# Patient Record
Sex: Male | Born: 1980 | Race: White | Hispanic: No | Marital: Single | Smoking: Former smoker
Health system: Southern US, Community
[De-identification: ages and names within clinical notes are randomized; demographics above are authoritative.]

## PROBLEM LIST (undated history)

## (undated) DIAGNOSIS — M5126 Other intervertebral disc displacement, lumbar region: Secondary | ICD-10-CM

## (undated) DIAGNOSIS — I10 Essential (primary) hypertension: Secondary | ICD-10-CM

---

## 2004-05-26 ENCOUNTER — Emergency Department: Payer: Self-pay | Admitting: Emergency Medicine

## 2007-04-27 ENCOUNTER — Emergency Department: Payer: Self-pay | Admitting: Emergency Medicine

## 2007-06-04 ENCOUNTER — Emergency Department: Payer: Self-pay | Admitting: Emergency Medicine

## 2007-07-08 ENCOUNTER — Ambulatory Visit: Payer: Self-pay | Admitting: Family Medicine

## 2013-09-01 ENCOUNTER — Encounter (HOSPITAL_COMMUNITY): Payer: Self-pay | Admitting: Pharmacy Technician

## 2013-09-01 ENCOUNTER — Other Ambulatory Visit: Payer: Self-pay | Admitting: Orthopedic Surgery

## 2013-09-01 NOTE — Progress Notes (Signed)
Need orders please - pt coming for preop THURS 09/03/13 - thank you

## 2013-09-02 ENCOUNTER — Other Ambulatory Visit (HOSPITAL_COMMUNITY): Payer: Self-pay | Admitting: Specialist

## 2013-09-02 NOTE — Patient Instructions (Signed)
Your procedure is scheduled on:  09/09/13  [facility]  [facility]- FOLLOW SIGNS TO SHORT STAY CENTER Short Stay Center at    0900   AM.   Call this number if you have problems the morning of surgery: [phone]        Do not eat food  Or drink :After Midnight. Tuesday NIGHT   Take these medicines the morning of surgery with A SIP OF WATER: MAY TAKE PERCOCET/ TORADOL/ ROBAXIN IF NEEDED   .  Contacts, dentures or partial plates, or metal hairpins  can not be worn to surgery. Your family will be responsible for glasses, dentures, hearing aides while you are in surgery  Leave suitcase in the car. After surgery it may be brought to your room.  For patients admitted to the hospital, checkout time is 11:00 AM day of  discharge.         [name] IS NOT RESPONSIBLE FOR ANY VALUABLES                                                                                                                                [name] - Preparing for Surgery Before surgery, you can play an important role.  Because skin is not sterile, your skin needs to be as free of germs as possible.  You can reduce the number of germs on your skin by washing with CHG (chlorahexidine gluconate) soap before surgery.  CHG is an antiseptic cleaner which kills germs and bonds with the skin to continue killing germs even after washing. Please DO NOT use if you have an allergy to CHG or antibacterial soaps.  If your skin becomes reddened/irritated stop using the CHG and inform your nurse when you arrive at Short Stay. Do not shave (including legs and underarms) for at least 48 hours prior to the first CHG shower.  You may shave your face/neck. Please follow these instructions carefully:  1.  Shower with CHG Soap the night before surgery and the  morning of Surgery.  2.  If you choose to wash your hair, wash your hair first as usual with your  normal  shampoo.  3.  After you shampoo, rinse  your hair and body thoroughly to remove the  shampoo.                           4.  Use CHG as you would any other liquid soap.  You can apply chg directly  to the skin and wash                       Gently with a scrungie or clean washcloth.  5.  Apply the CHG Soap to your body ONLY FROM THE NECK DOWN.   Do not use on face/ open  Wound or open sores. Avoid contact with eyes, ears mouth and genitals (private parts).                       Wash face,  Genitals (private parts) with your normal soap.             6.  Wash thoroughly, paying special attention to the area where your surgery  will be performed.  7.  Thoroughly rinse your body with warm water from the neck down.  8.  DO NOT shower/wash with your normal soap after using and rinsing off  the CHG Soap.                9.  Pat yourself dry with a clean towel.            10.  Wear clean pajamas.            11.  Place clean sheets on your bed the night of your first shower and do not  sleep with pets. Day of Surgery : Do not apply any lotions/deodorants the morning of surgery.  Please wear clean clothes to the hospital/surgery center.  FAILURE TO FOLLOW THESE INSTRUCTIONS MAY RESULT IN THE CANCELLATION OF YOUR SURGERY PATIENT SIGNATURE_________________________________  NURSE SIGNATURE__________________________________  ________________________________________________________________________ [facility] - Preparing for Surgery Before surgery, you can play an important role.  Because skin is not sterile, your skin needs to be as free of germs as possible.  You can reduce the number of germs on your skin by washing with CHG (chlorahexidine gluconate) soap before surgery.  CHG is an antiseptic cleaner which kills germs and bonds with the skin to continue killing germs even after washing. Please DO NOT use if you have an allergy to CHG or antibacterial soaps.  If your skin becomes reddened/irritated stop using the CHG and  inform your nurse when you arrive at Short Stay. Do not shave (including legs and underarms) for at least 48 hours prior to the first CHG shower.  You may shave your face/neck. Please follow these instructions carefully:  1.  Shower with CHG Soap the night before surgery and the  morning of Surgery.  2.  If you choose to wash your hair, wash your hair first as usual with your  normal  shampoo.  3.  After you shampoo, rinse your hair and body thoroughly to remove the  shampoo.                           4.  Use CHG as you would any other liquid soap.  You can apply chg directly  to the skin and wash                       Gently with a scrungie or clean washcloth.  5.  Apply the CHG Soap to your body ONLY FROM THE NECK DOWN.   Do not use on face/ open                           Wound or open sores. Avoid contact with eyes, ears mouth and genitals (private parts).                       Wash face,  Genitals (private parts) with your normal soap.  6.  Wash thoroughly, paying special attention to the area where your surgery  will be performed.  7.  Thoroughly rinse your body with warm water from the neck down.  8.  DO NOT shower/wash with your normal soap after using and rinsing off  the CHG Soap.                9.  Pat yourself dry with a clean towel.            10.  Wear clean pajamas.            11.  Place clean sheets on your bed the night of your first shower and do not  sleep with pets. Day of Surgery : Do not apply any lotions/deodorants the morning of surgery.  Please wear clean clothes to the hospital/surgery center.  FAILURE TO FOLLOW THESE INSTRUCTIONS MAY RESULT IN THE CANCELLATION OF YOUR SURGERY PATIENT SIGNATURE_________________________________  NURSE SIGNATURE__________________________________  ________________________________________________________________________

## 2013-09-03 ENCOUNTER — Encounter (INDEPENDENT_AMBULATORY_CARE_PROVIDER_SITE_OTHER): Payer: Self-pay

## 2013-09-03 ENCOUNTER — Ambulatory Visit (HOSPITAL_COMMUNITY)
Admission: RE | Admit: 2013-09-03 | Discharge: 2013-09-03 | Disposition: A | Discharge status: Home / Self Care | Payer: PRIVATE HEALTH INSURANCE | Source: Ambulatory Visit | Attending: Anesthesiology | Admitting: Anesthesiology

## 2013-09-03 ENCOUNTER — Ambulatory Visit (HOSPITAL_COMMUNITY)
Admission: RE | Admit: 2013-09-03 | Discharge: 2013-09-03 | Disposition: A | Discharge status: Home / Self Care | Payer: PRIVATE HEALTH INSURANCE | Source: Ambulatory Visit | Attending: Orthopedic Surgery | Admitting: Orthopedic Surgery

## 2013-09-03 ENCOUNTER — Other Ambulatory Visit: Payer: Self-pay | Admitting: Orthopedic Surgery

## 2013-09-03 ENCOUNTER — Encounter (HOSPITAL_COMMUNITY)
Admission: RE | Admit: 2013-09-03 | Discharge: 2013-09-03 | Disposition: A | Discharge status: Home / Self Care | Payer: PRIVATE HEALTH INSURANCE | Source: Ambulatory Visit | Attending: Specialist | Admitting: Specialist

## 2013-09-03 ENCOUNTER — Encounter (HOSPITAL_COMMUNITY): Payer: Self-pay

## 2013-09-03 DIAGNOSIS — Z01810 Encounter for preprocedural cardiovascular examination: Secondary | ICD-10-CM | POA: Insufficient documentation

## 2013-09-03 DIAGNOSIS — M4120 Other idiopathic scoliosis, site unspecified: Secondary | ICD-10-CM | POA: Insufficient documentation

## 2013-09-03 DIAGNOSIS — Z01812 Encounter for preprocedural laboratory examination: Secondary | ICD-10-CM | POA: Insufficient documentation

## 2013-09-03 DIAGNOSIS — Z01818 Encounter for other preprocedural examination: Secondary | ICD-10-CM | POA: Insufficient documentation

## 2013-09-03 HISTORY — DX: Other intervertebral disc displacement, lumbar region: M51.26

## 2013-09-03 HISTORY — DX: Essential (primary) hypertension: I10

## 2013-09-03 LAB — SURGICAL PCR SCREEN
MRSA, PCR: NEGATIVE
Staphylococcus aureus: POSITIVE — AB

## 2013-09-03 LAB — BASIC METABOLIC PANEL
BUN: 16 mg/dL (ref 6–23)
CALCIUM: 10 mg/dL (ref 8.4–10.5)
CO2: 26 mEq/L (ref 19–32)
CREATININE: 0.75 mg/dL (ref 0.50–1.35)
Chloride: 101 mEq/L (ref 96–112)
GFR calc Af Amer: >90 mL/min (ref >90)
GFR calc non Af Amer: >90 mL/min (ref >90)
GLUCOSE: 94 mg/dL (ref 70–99)
Potassium: 4.2 mEq/L (ref 3.7–5.3)
Sodium: 138 mEq/L (ref 137–147)

## 2013-09-03 LAB — CBC
HCT: 42.8 % (ref 39.0–52.0)
Hemoglobin: 15.4 g/dL (ref 13.0–17.0)
MCH: 30.9 pg (ref 26.0–34.0)
MCHC: 36 g/dL (ref 30.0–36.0)
MCV: 85.9 fL (ref 78.0–100.0)
Platelets: 295 10*3/uL (ref 150–400)
RBC: 4.98 MIL/uL (ref 4.22–5.81)
RDW: 12.1 % (ref 11.5–15.5)
WBC: 5 10*3/uL (ref 4.0–10.5)

## 2013-09-03 NOTE — H&P (Signed)
Randy [facility] is an 33 y.o. male.   Chief Complaint: back and L leg pain HPI: The patient is a 33 year old male here today in referral from Randy Pulling, PA-C with [facility]. The patient reports low back symptoms which began 2 week(s) (3 days) ago. The injury occurred 08/03/2013. The patient reports he was stepping off a piece of equipment and felt immediate pain at work. Symptoms are reported to be located in the left low back and Symptoms include pain, numbness and tingling. The pain radiates to the left buttock, left posterior thigh, left posterior lower leg and left foot. Prior to being seen today the patient was previously evaluated in urgent care. Past evaluation has included x-ray of the lumbar spine and MRI of the lumbar spine. Past treatment has included nonsteroidal anti-inflammatory drugs, opioid analgesics and corticosteroids (Prednisone Dosepak did not help). 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 Note for "Back pain": The patient is currently out of work. NCM is [employer].  Left lower extremity radicular pain.  Randy [facility] is a pleasant male. He is kindly referred by Randy [facility], case manager, for evaluation of numbness, pain and tingling in the left lower extremity. This was acutely precipitated following a work related injury dated 08-03-13. 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 He had an acute onset of pain into the buttock and down into the left leg. He has had numbness, tingling and weakness into the dorsum of the foot. This is severe pain. He has been unable to sleep, stand or walk for any periods of time or work. He reports being exhausted. He is here with his wife. He was seen by Randy Riis, PA-C at the [facility] who placed him on a Prednisone Dosepak without relief, analgesics, Hydrocodone and Ibuprofen. He is unable to sleep. He has had occasional positional problems. He can get no relief with any sustained position.  Past  Medical History  Diagnosis Date  . Hypertension     No past surgical history on file.  No family history on file. Social History:  reports that he has quit smoking. His smoking use included Cigarettes. He has a 1 pack-year smoking history. His smokeless tobacco use includes Snuff. He reports that he drinks alcohol. He reports that he does not use illicit drugs.  Allergies: No Known Allergies   (Not in a hospital admission)  Results for orders placed during the hospital encounter of 09/03/13 (from the past 48 hour(s))  BASIC METABOLIC PANEL     Status: None   Collection Time    09/03/13  8:50 AM      Result Value Ref Range   Sodium 138  137 - 147 mEq/L   Potassium 4.2  3.7 - 5.3 mEq/L   Chloride 101  96 - 112 mEq/L   CO2 26  19 - 32 mEq/L   Glucose, Bld 94  70 - 99 mg/dL   BUN 16  6 - 23 mg/dL   Creatinine, Ser 0.75  0.50 - 1.35 mg/dL   Calcium 10.0  8.4 - 10.5 mg/dL   GFR calc non Af Amer >90  >90 mL/min   GFR calc Af Amer >90  >90 mL/min   Comment: (NOTE)     The eGFR has been calculated using the CKD EPI equation.     This calculation has not been validated in all clinical situations.     eGFR's persistently <90 mL/min signify possible Chronic Kidney  Disease.  CBC     Status: None   Collection Time    09/03/13  8:50 AM      Result Value Ref Range   WBC 5.0  4.0 - 10.5 K/uL   RBC 4.98  4.22 - 5.81 MIL/uL   Hemoglobin 15.4  13.0 - 17.0 g/dL   HCT 42.8  39.0 - 52.0 %   MCV 85.9  78.0 - 100.0 fL   MCH 30.9  26.0 - 34.0 pg   MCHC 36.0  30.0 - 36.0 g/dL   RDW 12.1  11.5 - 15.5 %   Platelets 295  150 - 400 K/uL   Dg Chest 2 View  09/03/2013   CLINICAL DATA:  Preop lumbar decompression.  EXAM: CHEST  2 VIEW  COMPARISON:  None.  FINDINGS: The heart size and mediastinal contours are within normal limits. Both lungs are clear. The visualized skeletal structures are unremarkable.  IMPRESSION: No active cardiopulmonary disease.   Electronically Signed   By: Marin Olp  M.D.   On: 09/03/2013 09:56   Dg Lumbar Spine 2-3 Views  09/03/2013   CLINICAL DATA:  Preoperative evaluation for lumbar decompression  EXAM: LUMBAR SPINE - 2-3 VIEW  COMPARISON:  None.  FINDINGS: Five lumbar type vertebral bodies are well visualized. A very mild scoliosis concave to the right is noted in the mid lumbar spine. This may be somewhat positional in nature. Mild osteophytic changes are noted at L3-4 and posteriorly at L4-5. Films are numbered for a surgical localization.  IMPRESSION: Mild degenerative change.   Electronically Signed   By: Inez Catalina M.D.   On: 09/03/2013 10:04    Review of Systems  Constitutional: Negative.   HENT: Negative.   Eyes: Negative.   Respiratory: Negative.   Cardiovascular: Negative.   Gastrointestinal: Negative.   Genitourinary: Negative.   Musculoskeletal: Positive for back pain.  Skin: Negative.   Neurological: Positive for sensory change and focal weakness.  Psychiatric/Behavioral: Negative.     There were no vitals taken for this visit. Physical Exam  Constitutional: He is oriented to person, place, and time. He appears well-developed and well-nourished.  HENT:  Head: Normocephalic and atraumatic.  Eyes: Conjunctivae and EOM are normal. Pupils are equal, round, and reactive to light.  Neck: Normal range of motion. Neck supple.  Cardiovascular: Normal rate and regular rhythm.   Respiratory: Effort normal and breath sounds normal.  GI: Soft. Bowel sounds are normal.  Musculoskeletal:  He is in severe distress with standing. He is walking with an antalgic gait. He is tender in the left proximal gluteus. There is global limited range of motion in the lumbar spine in the seated position. He is unable to heel walk. In a seated position straight leg raise produces buttock, thigh and calf pain. This is exacerbated with a dorsal augmentation maneuver. This is negative on the right. He has a decreased sensation in the L5-S1 dermatome. He has 4/5  dorsiflexion on the left as compared to the right. There is 2+ DTR's in the knees bilaterally and 1+ in the Achilles. Altered sensation in the L5-S1 dermatome on the left. No instability with the hips, knees and ankles. I felt he had a stable nontender thoracis spine. Abdomen is soft and nontender. No flank pain with percussion. There was a full painless range of motion of the cervical spine. No upper extremity radiculopathy.  The patient pain drawing is organic. He rates his pain as an 8/10 on the Visual Analog Scale. I  reviewed his outside medical records from  [facility], [place] [facility]. He indicated he was placed on Prednisone and Vicodin.  Neurological: He is alert and oriented to person, place, and time. He has normal reflexes.  Skin: Skin is warm and dry.  Psychiatric: He has a normal mood and affect.    He had an MRI indicating multi-level disk degeneration. There may be a small free fragment at L4-5. They suggested possible IV Gadolinium for further evaluation. Mild to moderate stenosis on the left neural foramen. There is some degeneration noted at L4-5 as well.  Three view radiographs, AP lateral flexion and extension of the lumbar spine demonstrates minimal positional scoliosis on the AP. Hips are unremarkable. On the lateral he has an osteophytic spur at L4-5. Minimal retrolisthesis. No instability with flexion and extension. Moderate disk degeneration at L5-S1 with associated neural foraminal narrowing. No pars defect  [facility] from [facility] demonstrates a free fragment on the left into the neural foramen of L5, compressing the L5 root. This is noted particularly on the parasagittal images of T2, most likely emanating from L4-5.  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 Acute L5 radiculopathy with secondary disk herniation with a free fragment compressing the L5 root and entering the lateral recess at L4-5. 2. 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 Refractory to conservative treatment with severe pain.  I had an extensive discussion with Mr. 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 His mechanism of injury is such, with flexion and lateral rotation, being consistent with an acute disk injury and production of acute radicular pain. He does have a neural compressive lesion noted by MRI , namely a free fragment into the foramen L5. He has severe pain with neural tension signs and EHL weakness as well as dorsiflexion weakness. There is numbness in the L5-S1 nerve root distribution.  We attempted to place him in multiple positions today. In the prone position, seated and lateral flexion there was no positional preference to relieve his symptoms. Given the Duration of his symptoms, the duration of the numbness and weakness and the severity of pain in the presence of a neural compressive lesion, I do feel it is appropriate to consider a lumbar decompression with removal of the free fragment at this point to allow nerve root recovery. Given the severity of his pain, neural tension signs and lack of relieving position, I do not feel he would tolerate or benefit by a formal supervised physical therapy program. I feel that would be counterproductive and only delay the neural compression. I discussed certain positions, supine with knee flexed to unload the region as we prepare to proceed with a lumbar decompression. I provided him with an illustrated handout, I discussed this in detail. No history of MRSA or DVT is noted. 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 He does have underlying degenerative changes. We discussed the possibility of residual back pain and the possibility of recurrent disk  herniation as well as the time out of work. This would be overnight in the hospital, two weeks for suture removal. Physical therapy will be initiated at 1-2 weeks post operative. At the 6 week mark post operatively, certainly light duty can be considered. I would consider him at maximum medical improvement  at 3-4 months post operative, initiating when appropriate a work conditioning program at 6-7 weeks post operatively for a period of 4 weeks. Our functional goal following this injury and surgical procedure would be a medium job classification to avoid heavy lifting,repetitive and unsupported bending and extended standing or sitting. If he worsens acutely in the interim he is to call. We will proceed accordingly within the next week, once approval has been obtained. In the interim we will augment his analgesics to utilize Percocet, Robaxin and concomitant Toradol. He will discontinue his Motrin while taking Toradol. Hopefully, that will gain him some interval relief.  I had an extensive discussion of the risks and benefits of the lumbar decompression with the patient including bleeding, infection, damage to neurovascular structures, epidural fibrosis, CSF leak requiring repair. We also discussed increase in pain, adjacent segment disease, recurrent disc herniation, need for future surgery including repeat decompression and/or fusion. We also discussed risks of postoperative hematoma, paralysis, anesthetic complications including DVT, PE, death, cardiopulmonary dysfunction. In addition, the perioperative and postoperative courses were discussed in detail including the rehabilitative time and return to functional activity and work. I provided the patient with an illustrated handout and utilized the appropriate surgical models.  Plan microlumbar decompression L4-5, L5-S1   Conley Rolls. Onita Pfluger PA-C for Dr. Tonita Cong 09/03/2013, 2:15 PM

## 2013-09-03 NOTE — Progress Notes (Signed)
Chest x ray report 08/14/13 chart

## 2013-09-09 ENCOUNTER — Ambulatory Visit (HOSPITAL_COMMUNITY): Payer: PRIVATE HEALTH INSURANCE

## 2013-09-09 ENCOUNTER — Ambulatory Visit (HOSPITAL_COMMUNITY): Payer: PRIVATE HEALTH INSURANCE | Admitting: Anesthesiology

## 2013-09-09 ENCOUNTER — Encounter (HOSPITAL_COMMUNITY): Payer: Self-pay | Admitting: *Deleted

## 2013-09-09 ENCOUNTER — Encounter (HOSPITAL_COMMUNITY)
Admission: RE | Disposition: A | Discharge status: Home / Self Care | Payer: Self-pay | Source: Ambulatory Visit | Attending: Specialist

## 2013-09-09 ENCOUNTER — Encounter (HOSPITAL_COMMUNITY): Payer: PRIVATE HEALTH INSURANCE | Admitting: Anesthesiology

## 2013-09-09 ENCOUNTER — Ambulatory Visit (HOSPITAL_COMMUNITY)
Admission: RE | Admit: 2013-09-09 | Discharge: 2013-09-10 | Disposition: A | Discharge status: Home / Self Care | Payer: PRIVATE HEALTH INSURANCE | Source: Ambulatory Visit | Attending: Specialist | Admitting: Specialist

## 2013-09-09 DIAGNOSIS — Q762 Congenital spondylolisthesis: Secondary | ICD-10-CM | POA: Insufficient documentation

## 2013-09-09 DIAGNOSIS — Z8739 Personal history of other diseases of the musculoskeletal system and connective tissue: Secondary | ICD-10-CM | POA: Diagnosis present

## 2013-09-09 DIAGNOSIS — M7130 Other bursal cyst, unspecified site: Secondary | ICD-10-CM | POA: Insufficient documentation

## 2013-09-09 DIAGNOSIS — M5126 Other intervertebral disc displacement, lumbar region: Secondary | ICD-10-CM

## 2013-09-09 DIAGNOSIS — Z87891 Personal history of nicotine dependence: Secondary | ICD-10-CM | POA: Insufficient documentation

## 2013-09-09 DIAGNOSIS — M4120 Other idiopathic scoliosis, site unspecified: Secondary | ICD-10-CM | POA: Insufficient documentation

## 2013-09-09 DIAGNOSIS — I10 Essential (primary) hypertension: Secondary | ICD-10-CM | POA: Insufficient documentation

## 2013-09-09 HISTORY — PX: LUMBAR LAMINECTOMY/DECOMPRESSION MICRODISCECTOMY: SHX5026

## 2013-09-09 SURGERY — LUMBAR LAMINECTOMY/DECOMPRESSION MICRODISCECTOMY 2 LEVELS
Anesthesia: General | Site: Back

## 2013-09-09 MED ORDER — SODIUM CHLORIDE 0.9 % IJ SOLN: 3.0000 mL | Freq: Two times a day (BID) | INTRAMUSCULAR | Status: DC

## 2013-09-09 MED ORDER — HYDROCODONE-ACETAMINOPHEN 5-325 MG PO TABS: 1.0000 | ORAL_TABLET | ORAL | Status: DC | PRN

## 2013-09-09 MED ORDER — PROPOFOL 10 MG/ML IV BOLUS
INTRAVENOUS | Status: DC | PRN
  Administered 2013-09-09: 200 mg via INTRAVENOUS

## 2013-09-09 MED ORDER — THROMBIN 5000 UNITS EX SOLR
CUTANEOUS | Status: AC
  Filled 2013-09-09: qty 10000

## 2013-09-09 MED ORDER — ONDANSETRON HCL 4 MG/2ML IJ SOLN
INTRAMUSCULAR | Status: AC
  Filled 2013-09-09: qty 2

## 2013-09-09 MED ORDER — FENTANYL CITRATE 0.05 MG/ML IJ SOLN
INTRAMUSCULAR | Status: DC | PRN
  Administered 2013-09-09 (×3): 50 ug via INTRAVENOUS
  Administered 2013-09-09: 100 ug via INTRAVENOUS

## 2013-09-09 MED ORDER — MIDAZOLAM HCL 5 MG/5ML IJ SOLN
INTRAMUSCULAR | Status: DC | PRN
  Administered 2013-09-09: 2 mg via INTRAVENOUS

## 2013-09-09 MED ORDER — SODIUM CHLORIDE 0.9 % IR SOLN
Status: DC | PRN
  Administered 2013-09-09: 12:00:00

## 2013-09-09 MED ORDER — CEFAZOLIN SODIUM-DEXTROSE 2-3 GM-% IV SOLR
INTRAVENOUS | Status: AC
  Filled 2013-09-09: qty 50

## 2013-09-09 MED ORDER — GLYCOPYRROLATE 0.2 MG/ML IJ SOLN
INTRAMUSCULAR | Status: AC
  Filled 2013-09-09: qty 3

## 2013-09-09 MED ORDER — THROMBIN 5000 UNITS EX SOLR
CUTANEOUS | Status: DC | PRN
  Administered 2013-09-09: 12:00:00 via TOPICAL

## 2013-09-09 MED ORDER — ACETAMINOPHEN 10 MG/ML IV SOLN
1000.0000 mg | Freq: Once | INTRAVENOUS | Status: AC
Start: 2013-09-09 — End: 2013-09-09
  Administered 2013-09-09: 1000 mg via INTRAVENOUS
  Filled 2013-09-09: qty 100

## 2013-09-09 MED ORDER — LIDOCAINE HCL (CARDIAC) 20 MG/ML IV SOLN
INTRAVENOUS | Status: AC
  Filled 2013-09-09: qty 5

## 2013-09-09 MED ORDER — OXYCODONE-ACETAMINOPHEN 5-325 MG PO TABS: 1.0000 | ORAL_TABLET | ORAL | Status: DC | PRN

## 2013-09-09 MED ORDER — SODIUM CHLORIDE 0.45 % IV SOLN: INTRAVENOUS | Status: DC

## 2013-09-09 MED ORDER — MEPERIDINE HCL 50 MG/ML IJ SOLN: 6.2500 mg | INTRAMUSCULAR | Status: DC | PRN

## 2013-09-09 MED ORDER — CEFAZOLIN SODIUM-DEXTROSE 2-3 GM-% IV SOLR
2.0000 g | Freq: Three times a day (TID) | INTRAVENOUS | Status: AC
  Administered 2013-09-09 – 2013-09-10 (×2): 2 g via INTRAVENOUS
  Filled 2013-09-09 (×2): qty 50

## 2013-09-09 MED ORDER — OXYCODONE-ACETAMINOPHEN 5-325 MG PO TABS
1.0000 | ORAL_TABLET | ORAL | Status: DC | PRN
  Administered 2013-09-09 – 2013-09-10 (×4): 2 via ORAL
  Filled 2013-09-09 (×4): qty 2

## 2013-09-09 MED ORDER — MENTHOL 3 MG MT LOZG: 1.0000 | LOZENGE | OROMUCOSAL | Status: DC | PRN

## 2013-09-09 MED ORDER — HYDROMORPHONE HCL PF 1 MG/ML IJ SOLN
0.5000 mg | INTRAMUSCULAR | Status: DC | PRN
  Administered 2013-09-09 (×3): 1 mg via INTRAVENOUS
  Administered 2013-09-10: 0.5 mg via INTRAVENOUS
  Administered 2013-09-10: 1 mg via INTRAVENOUS
  Filled 2013-09-09 (×5): qty 1

## 2013-09-09 MED ORDER — MIDAZOLAM HCL 2 MG/2ML IJ SOLN
INTRAMUSCULAR | Status: AC
  Filled 2013-09-09: qty 2

## 2013-09-09 MED ORDER — LIDOCAINE HCL (CARDIAC) 20 MG/ML IV SOLN
INTRAVENOUS | Status: DC | PRN
  Administered 2013-09-09: 60 mg via INTRAVENOUS

## 2013-09-09 MED ORDER — ROCURONIUM BROMIDE 100 MG/10ML IV SOLN
INTRAVENOUS | Status: DC | PRN
  Administered 2013-09-09 (×2): 10 mg via INTRAVENOUS
  Administered 2013-09-09: 50 mg via INTRAVENOUS

## 2013-09-09 MED ORDER — PROMETHAZINE HCL 25 MG/ML IJ SOLN: 6.2500 mg | INTRAMUSCULAR | Status: DC | PRN

## 2013-09-09 MED ORDER — ACETAMINOPHEN 650 MG RE SUPP: 650.0000 mg | RECTAL | Status: DC | PRN

## 2013-09-09 MED ORDER — BUPIVACAINE-EPINEPHRINE (PF) 0.5% -1:200000 IJ SOLN
INTRAMUSCULAR | Status: AC
  Filled 2013-09-09: qty 30

## 2013-09-09 MED ORDER — ROCURONIUM BROMIDE 100 MG/10ML IV SOLN
INTRAVENOUS | Status: AC
  Filled 2013-09-09: qty 1

## 2013-09-09 MED ORDER — ONDANSETRON HCL 4 MG/2ML IJ SOLN: 4.0000 mg | INTRAMUSCULAR | Status: DC | PRN

## 2013-09-09 MED ORDER — METHOCARBAMOL 500 MG PO TABS: 500.0000 mg | ORAL_TABLET | Freq: Three times a day (TID) | ORAL | Status: DC | PRN

## 2013-09-09 MED ORDER — NEOSTIGMINE METHYLSULFATE 10 MG/10ML IV SOLN
INTRAVENOUS | Status: AC
  Filled 2013-09-09: qty 1

## 2013-09-09 MED ORDER — DEXAMETHASONE SODIUM PHOSPHATE 10 MG/ML IJ SOLN
INTRAMUSCULAR | Status: AC
  Filled 2013-09-09: qty 1

## 2013-09-09 MED ORDER — ONDANSETRON HCL 4 MG/2ML IJ SOLN
INTRAMUSCULAR | Status: DC | PRN
  Administered 2013-09-09: 4 mg via INTRAVENOUS

## 2013-09-09 MED ORDER — NEOSTIGMINE METHYLSULFATE 10 MG/10ML IV SOLN
INTRAVENOUS | Status: DC | PRN
  Administered 2013-09-09: 4 mg via INTRAVENOUS

## 2013-09-09 MED ORDER — DOCUSATE SODIUM 100 MG PO CAPS
100.0000 mg | ORAL_CAPSULE | Freq: Two times a day (BID) | ORAL | Status: DC
  Administered 2013-09-09 – 2013-09-10 (×2): 100 mg via ORAL

## 2013-09-09 MED ORDER — LACTATED RINGERS IV SOLN
INTRAVENOUS | Status: DC
  Administered 2013-09-09: 13:00:00 via INTRAVENOUS
  Administered 2013-09-09: 1000 mL via INTRAVENOUS

## 2013-09-09 MED ORDER — THROMBIN 5000 UNITS EX SOLR
OROMUCOSAL | Status: DC | PRN
  Administered 2013-09-09: 12:00:00 via TOPICAL

## 2013-09-09 MED ORDER — OXYCODONE HCL 5 MG/5ML PO SOLN: 5.0000 mg | Freq: Once | ORAL | Status: DC | PRN

## 2013-09-09 MED ORDER — FENTANYL CITRATE 0.05 MG/ML IJ SOLN
INTRAMUSCULAR | Status: AC
  Filled 2013-09-09: qty 5

## 2013-09-09 MED ORDER — SODIUM CHLORIDE 0.9 % IV SOLN: 250.0000 mL | INTRAVENOUS | Status: DC

## 2013-09-09 MED ORDER — HYDROMORPHONE HCL PF 1 MG/ML IJ SOLN
0.2500 mg | INTRAMUSCULAR | Status: DC | PRN
  Administered 2013-09-09 (×2): 0.5 mg via INTRAVENOUS

## 2013-09-09 MED ORDER — BUPIVACAINE-EPINEPHRINE 0.5% -1:200000 IJ SOLN
INTRAMUSCULAR | Status: AC
  Filled 2013-09-09: qty 1

## 2013-09-09 MED ORDER — OXYCODONE HCL 5 MG PO TABS: 5.0000 mg | ORAL_TABLET | Freq: Once | ORAL | Status: DC | PRN

## 2013-09-09 MED ORDER — BUPIVACAINE-EPINEPHRINE (PF) 0.5% -1:200000 IJ SOLN
INTRAMUSCULAR | Status: DC | PRN
  Administered 2013-09-09: 9 mL

## 2013-09-09 MED ORDER — PHENOL 1.4 % MT LIQD: 1.0000 | OROMUCOSAL | Status: DC | PRN

## 2013-09-09 MED ORDER — ACETAMINOPHEN 325 MG PO TABS: 650.0000 mg | ORAL_TABLET | ORAL | Status: DC | PRN

## 2013-09-09 MED ORDER — HYDROMORPHONE HCL PF 1 MG/ML IJ SOLN
INTRAMUSCULAR | Status: AC
  Administered 2013-09-09: 1 mg via INTRAVENOUS
  Filled 2013-09-09: qty 1

## 2013-09-09 MED ORDER — SODIUM CHLORIDE 0.9 % IJ SOLN: 3.0000 mL | INTRAMUSCULAR | Status: DC | PRN

## 2013-09-09 MED ORDER — DEXAMETHASONE SODIUM PHOSPHATE 4 MG/ML IJ SOLN
INTRAMUSCULAR | Status: DC | PRN
  Administered 2013-09-09: 10 mg via INTRAVENOUS

## 2013-09-09 MED ORDER — CEFAZOLIN SODIUM-DEXTROSE 2-3 GM-% IV SOLR
2.0000 g | INTRAVENOUS | Status: AC
  Administered 2013-09-09: 2 g via INTRAVENOUS

## 2013-09-09 MED ORDER — DOCUSATE SODIUM 100 MG PO CAPS: 100.0000 mg | ORAL_CAPSULE | Freq: Two times a day (BID) | ORAL | Status: DC | PRN

## 2013-09-09 MED ORDER — GLYCOPYRROLATE 0.2 MG/ML IJ SOLN
INTRAMUSCULAR | Status: DC | PRN
  Administered 2013-09-09: 0.6 mg via INTRAVENOUS

## 2013-09-09 MED ORDER — PROPOFOL 10 MG/ML IV BOLUS
INTRAVENOUS | Status: AC
  Filled 2013-09-09: qty 20

## 2013-09-09 SURGICAL SUPPLY — 43 items
BAG ZIPLOCK 12X15 (MISCELLANEOUS) IMPLANT
CLEANER TIP ELECTROSURG 2X2 (MISCELLANEOUS) ×2 IMPLANT
CLOTH 2% CHLOROHEXIDINE 3PK (PERSONAL CARE ITEMS) ×2 IMPLANT
DECANTER SPIKE VIAL GLASS SM (MISCELLANEOUS) ×2 IMPLANT
DRAPE MICROSCOPE LEICA (MISCELLANEOUS) ×2 IMPLANT
DRAPE POUCH INSTRU U-SHP 10X18 (DRAPES) ×2 IMPLANT
DRAPE SURG 17X11 SM STRL (DRAPES) ×2 IMPLANT
DRAPE UTILITY XL STRL (DRAPES) ×2 IMPLANT
DRSG AQUACEL AG ADV 3.5X 4 (GAUZE/BANDAGES/DRESSINGS) ×2 IMPLANT
DRSG AQUACEL AG ADV 3.5X 6 (GAUZE/BANDAGES/DRESSINGS) IMPLANT
DURAPREP 26ML APPLICATOR (WOUND CARE) ×2 IMPLANT
DURASEAL SPINE SEALANT 3ML (MISCELLANEOUS) IMPLANT
ELECT BLADE TIP CTD 4 INCH (ELECTRODE) ×2 IMPLANT
ELECT REM PT RETURN 9FT ADLT (ELECTROSURGICAL) ×2
ELECTRODE REM PT RTRN 9FT ADLT (ELECTROSURGICAL) ×1 IMPLANT
GLOVE BIOGEL PI IND STRL 7.5 (GLOVE) ×1 IMPLANT
GLOVE BIOGEL PI INDICATOR 7.5 (GLOVE) ×1
GLOVE SURG SS PI 7.5 STRL IVOR (GLOVE) ×2 IMPLANT
GLOVE SURG SS PI 8.0 STRL IVOR (GLOVE) ×4 IMPLANT
GOWN STRL REUS W/TWL XL LVL3 (GOWN DISPOSABLE) ×4 IMPLANT
IV CATH 14GX2 1/4 (CATHETERS) IMPLANT
KIT BASIN OR (CUSTOM PROCEDURE TRAY) ×2 IMPLANT
KIT POSITIONING SURG ANDREWS (MISCELLANEOUS) ×2 IMPLANT
MANIFOLD NEPTUNE II (INSTRUMENTS) ×2 IMPLANT
NEEDLE SPNL 18GX3.5 QUINCKE PK (NEEDLE) ×6 IMPLANT
PATTIES SURGICAL .5 X.5 (GAUZE/BANDAGES/DRESSINGS) IMPLANT
PATTIES SURGICAL .75X.75 (GAUZE/BANDAGES/DRESSINGS) IMPLANT
PATTIES SURGICAL 1X1 (DISPOSABLE) IMPLANT
SPONGE SURGIFOAM ABS GEL 100 (HEMOSTASIS) ×2 IMPLANT
STRIP CLOSURE SKIN 1/2X4 (GAUZE/BANDAGES/DRESSINGS) ×2 IMPLANT
SUT NURALON 4 0 TR CR/8 (SUTURE) IMPLANT
SUT PROLENE 3 0 PS 2 (SUTURE) ×2 IMPLANT
SUT VIC AB 1 CT1 27 (SUTURE)
SUT VIC AB 1 CT1 27XBRD ANTBC (SUTURE) IMPLANT
SUT VIC AB 1-0 CT2 27 (SUTURE) ×2 IMPLANT
SUT VIC AB 2-0 CT1 27 (SUTURE)
SUT VIC AB 2-0 CT1 TAPERPNT 27 (SUTURE) IMPLANT
SUT VIC AB 2-0 CT2 27 (SUTURE) ×2 IMPLANT
SYRINGE 10CC LL (SYRINGE) ×2 IMPLANT
TOWEL OR 17X26 10 PK STRL BLUE (TOWEL DISPOSABLE) ×2 IMPLANT
TOWEL OR NON WOVEN STRL DISP B (DISPOSABLE) ×2 IMPLANT
TRAY LAMINECTOMY (CUSTOM PROCEDURE TRAY) ×2 IMPLANT
YANKAUER SUCT BULB TIP NO VENT (SUCTIONS) ×2 IMPLANT

## 2013-09-09 NOTE — Anesthesia Preprocedure Evaluation (Addendum)
Anesthesia Evaluation    Airway Mallampati: II TM Distance: >3 FB Neck ROM: Full    Dental  (+) Dental Advisory Given   Pulmonary former smoker,  breath sounds clear to auscultation        Cardiovascular hypertension, Rhythm:Regular Rate:Normal     Neuro/Psych    GI/Hepatic   Endo/Other    Renal/GU      Musculoskeletal   Abdominal   Peds  Hematology   Anesthesia Other Findings   Reproductive/Obstetrics                          Anesthesia Physical Anesthesia Plan  ASA: II  Anesthesia Plan: General   Post-op Pain Management:    Induction: Intravenous  Airway Management Planned: Oral ETT  Additional Equipment:   Intra-op Plan:   Post-operative Plan: Extubation in OR  Informed Consent: I have reviewed the patients History and Physical, chart, labs and discussed the procedure including the risks, benefits and alternatives for the proposed anesthesia with the patient or authorized representative who has indicated his/her understanding and acceptance.   Dental advisory given  Plan Discussed with: CRNA  Anesthesia Plan Comments:         Anesthesia Quick Evaluation

## 2013-09-09 NOTE — H&P (View-Only) (Signed)
Randy Buchanan is an 33 y.o. male.   Chief Complaint: back and L leg pain HPI: The patient is a 33 year old male here today in referral from Othelia Pulling, PA-C with [facility]. The patient reports low back symptoms which began 2 week(s) (3 days) ago. The injury occurred 08/03/2013. The patient reports he was stepping off a piece of equipment and felt immediate pain at work. Symptoms are reported to be located in the left low back and Symptoms include pain, numbness and tingling. The pain radiates to the left buttock, left posterior thigh, left posterior lower leg and left foot. Prior to being seen today the patient was previously evaluated in urgent care. Past evaluation has included x-ray of the lumbar spine and MRI of the lumbar spine. Past treatment has included nonsteroidal anti-inflammatory drugs, opioid analgesics and corticosteroids (Prednisone Dosepak did not help). 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 Note for "Back pain": The patient is currently out of work. NCM is [employer].  Left lower extremity radicular pain.  Randy Buchanan is a pleasant male. He is kindly referred by Enriqueta Shutter, case manager, for evaluation of numbness, pain and tingling in the left lower extremity. This was acutely precipitated following a work related injury dated 08-03-13. 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 He had an acute onset of pain into the buttock and down into the left leg. He has had numbness, tingling and weakness into the dorsum of the foot. This is severe pain. He has been unable to sleep, stand or walk for any periods of time or work. He reports being exhausted. He is here with his wife. He was seen by Nori Riis, PA-C at the [facility] who placed him on a Prednisone Dosepak without relief, analgesics, Hydrocodone and Ibuprofen. He is unable to sleep. He has had occasional positional problems. He can get no relief with any sustained position.  Past  Medical History  Diagnosis Date  . Hypertension     No past surgical history on file.  No family history on file. Social History:  reports that he has quit smoking. His smoking use included Cigarettes. He has a 1 pack-year smoking history. His smokeless tobacco use includes Snuff. He reports that he drinks alcohol. He reports that he does not use illicit drugs.  Allergies: No Known Allergies   (Not in a hospital admission)  Results for orders placed during the hospital encounter of 09/03/13 (from the past 48 hour(s))  BASIC METABOLIC PANEL     Status: None   Collection Time    09/03/13  8:50 AM      Result Value Ref Range   Sodium 138  137 - 147 mEq/L   Potassium 4.2  3.7 - 5.3 mEq/L   Chloride 101  96 - 112 mEq/L   CO2 26  19 - 32 mEq/L   Glucose, Bld 94  70 - 99 mg/dL   BUN 16  6 - 23 mg/dL   Creatinine, Ser 0.75  0.50 - 1.35 mg/dL   Calcium 10.0  8.4 - 10.5 mg/dL   GFR calc non Af Amer >90  >90 mL/min   GFR calc Af Amer >90  >90 mL/min   Comment: (NOTE)     The eGFR has been calculated using the CKD EPI equation.     This calculation has not been validated in all clinical situations.     eGFR's persistently <90 mL/min signify possible Chronic Kidney  Disease.  CBC     Status: None   Collection Time    09/03/13  8:50 AM      Result Value Ref Range   WBC 5.0  4.0 - 10.5 K/uL   RBC 4.98  4.22 - 5.81 MIL/uL   Hemoglobin 15.4  13.0 - 17.0 g/dL   HCT 42.8  39.0 - 52.0 %   MCV 85.9  78.0 - 100.0 fL   MCH 30.9  26.0 - 34.0 pg   MCHC 36.0  30.0 - 36.0 g/dL   RDW 12.1  11.5 - 15.5 %   Platelets 295  150 - 400 K/uL   Dg Chest 2 View  09/03/2013   CLINICAL DATA:  Preop lumbar decompression.  EXAM: CHEST  2 VIEW  COMPARISON:  None.  FINDINGS: The heart size and mediastinal contours are within normal limits. Both lungs are clear. The visualized skeletal structures are unremarkable.  IMPRESSION: No active cardiopulmonary disease.   Electronically Signed   By: Marin Olp  M.D.   On: 09/03/2013 09:56   Dg Lumbar Spine 2-3 Views  09/03/2013   CLINICAL DATA:  Preoperative evaluation for lumbar decompression  EXAM: LUMBAR SPINE - 2-3 VIEW  COMPARISON:  None.  FINDINGS: Five lumbar type vertebral bodies are well visualized. A very mild scoliosis concave to the right is noted in the mid lumbar spine. This may be somewhat positional in nature. Mild osteophytic changes are noted at L3-4 and posteriorly at L4-5. Films are numbered for a surgical localization.  IMPRESSION: Mild degenerative change.   Electronically Signed   By: Inez Catalina M.D.   On: 09/03/2013 10:04    Review of Systems  Constitutional: Negative.   HENT: Negative.   Eyes: Negative.   Respiratory: Negative.   Cardiovascular: Negative.   Gastrointestinal: Negative.   Genitourinary: Negative.   Musculoskeletal: Positive for back pain.  Skin: Negative.   Neurological: Positive for sensory change and focal weakness.  Psychiatric/Behavioral: Negative.     There were no vitals taken for this visit. Physical Exam  Constitutional: He is oriented to person, place, and time. He appears well-developed and well-nourished.  HENT:  Head: Normocephalic and atraumatic.  Eyes: Conjunctivae and EOM are normal. Pupils are equal, round, and reactive to light.  Neck: Normal range of motion. Neck supple.  Cardiovascular: Normal rate and regular rhythm.   Respiratory: Effort normal and breath sounds normal.  GI: Soft. Bowel sounds are normal.  Musculoskeletal:  He is in severe distress with standing. He is walking with an antalgic gait. He is tender in the left proximal gluteus. There is global limited range of motion in the lumbar spine in the seated position. He is unable to heel walk. In a seated position straight leg raise produces buttock, thigh and calf pain. This is exacerbated with a dorsal augmentation maneuver. This is negative on the right. He has a decreased sensation in the L5-S1 dermatome. He has 4/5  dorsiflexion on the left as compared to the right. There is 2+ DTR's in the knees bilaterally and 1+ in the Achilles. Altered sensation in the L5-S1 dermatome on the left. No instability with the hips, knees and ankles. I felt he had a stable nontender thoracis spine. Abdomen is soft and nontender. No flank pain with percussion. There was a full painless range of motion of the cervical spine. No upper extremity radiculopathy.  The patient pain drawing is organic. He rates his pain as an 8/10 on the Visual Analog Scale. I  reviewed his outside medical records from  Othelia Pulling, [place] from [facility]. He indicated he was placed on Prednisone and Vicodin.  Neurological: He is alert and oriented to person, place, and time. He has normal reflexes.  Skin: Skin is warm and dry.  Psychiatric: He has a normal mood and affect.    He had an MRI indicating multi-level disk degeneration. There may be a small free fragment at L4-5. They suggested possible IV Gadolinium for further evaluation. Mild to moderate stenosis on the left neural foramen. There is some degeneration noted at L4-5 as well.  Three view radiographs, AP lateral flexion and extension of the lumbar spine demonstrates minimal positional scoliosis on the AP. Hips are unremarkable. On the lateral he has an osteophytic spur at L4-5. Minimal retrolisthesis. No instability with flexion and extension. Moderate disk degeneration at L5-S1 with associated neural foraminal narrowing. No pars defect  MRI from [facility] demonstrates a free fragment on the left into the neural foramen of L5, compressing the L5 root. This is noted particularly on the parasagittal images of T2, most likely emanating from L4-5.  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 Acute L5 radiculopathy with secondary disk herniation with a free fragment compressing the L5 root and entering the lateral recess at L4-5. 2. 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 Refractory to conservative treatment with severe pain.  I had an extensive discussion with Mr. Buchta concerning his current pathology, relative anatomy and treatment options in front of the patient, Enriqueta Shutter, case Freight forwarder and his wife. His mechanism of injury is such, with flexion and lateral rotation, being consistent with an acute disk injury and production of acute radicular pain. He does have a neural compressive lesion noted by MRI , namely a free fragment into the foramen L5. He has severe pain with neural tension signs and EHL weakness as well as dorsiflexion weakness. There is numbness in the L5-S1 nerve root distribution.  We attempted to place him in multiple positions today. In the prone position, seated and lateral flexion there was no positional preference to relieve his symptoms. Given the Duration of his symptoms, the duration of the numbness and weakness and the severity of pain in the presence of a neural compressive lesion, I do feel it is appropriate to consider a lumbar decompression with removal of the free fragment at this point to allow nerve root recovery. Given the severity of his pain, neural tension signs and lack of relieving position, I do not feel he would tolerate or benefit by a formal supervised physical therapy program. I feel that would be counterproductive and only delay the neural compression. I discussed certain positions, supine with knee flexed to unload the region as we prepare to proceed with a lumbar decompression. I provided him with an illustrated handout, I discussed this in detail. No history of MRSA or DVT is noted. 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 He does have underlying degenerative changes. We discussed the possibility of residual back pain and the possibility of recurrent disk  herniation as well as the time out of work. This would be overnight in the hospital, two weeks for suture removal. Physical therapy will be initiated at 1-2 weeks post operative. At the 6 week mark post operatively, certainly light duty can be considered. I would consider him at maximum medical improvement  at 3-4 months post operative, initiating when appropriate a work conditioning program at 6-7 weeks post operatively for a period of 4 weeks. Our functional goal following this injury and surgical procedure would be a medium job classification to avoid heavy lifting,repetitive and unsupported bending and extended standing or sitting. If he worsens acutely in the interim he is to call. We will proceed accordingly within the next week, once approval has been obtained. In the interim we will augment his analgesics to utilize Percocet, Robaxin and concomitant Toradol. He will discontinue his Motrin while taking Toradol. Hopefully, that will gain him some interval relief.  I had an extensive discussion of the risks and benefits of the lumbar decompression with the patient including bleeding, infection, damage to neurovascular structures, epidural fibrosis, CSF leak requiring repair. We also discussed increase in pain, adjacent segment disease, recurrent disc herniation, need for future surgery including repeat decompression and/or fusion. We also discussed risks of postoperative hematoma, paralysis, anesthetic complications including DVT, PE, death, cardiopulmonary dysfunction. In addition, the perioperative and postoperative courses were discussed in detail including the rehabilitative time and return to functional activity and work. I provided the patient with an illustrated handout and utilized the appropriate surgical models.  Plan microlumbar decompression L4-5, L5-S1   Conley Rolls. Bissell PA-C for Dr. Tonita Cong 09/03/2013, 2:15 PM

## 2013-09-09 NOTE — Anesthesia Postprocedure Evaluation (Signed)
Anesthesia Post Note  Patient: Randy Buchanan  Procedure(s) Performed: Procedure(s) (LRB): MICRODISCECTOMY  LUMBAR FOUR TO LUMBAR FIVE  (N/A)  Anesthesia type: General  Patient location: PACU  Post pain: Pain level controlled  Post assessment: Post-op Vital signs reviewed  Last Vitals: BP 124/77  Pulse 84  Temp(Src) 36 C (Axillary)  Resp 16  Ht 5\' 8"  (1.727 m)  Wt 186 lb (84.369 kg)  BMI 28.29 kg/m2  SpO2 99%  Post vital signs: Reviewed  Level of consciousness: sedated  Complications: No apparent anesthesia complications

## 2013-09-09 NOTE — Interval H&P Note (Signed)
History and Physical Interval Note:  09/09/2013 8:09 AM  Randy Buchanan  has presented today for surgery, with the diagnosis of HNP L4-L5      The various methods of treatment have been discussed with the patient and family. After consideration of risks, benefits and other options for treatment, the patient has consented to  Procedure(s): MICROLUMBAR DECOMPRESSION L4-L5 L5-S1  (2 LEVELS) (N/A) as a surgical intervention .  The patient's history has been reviewed, patient examined, no change in status, stable for surgery.  I have reviewed the patient's chart and labs.  Questions were answered to the patient's satisfaction.     [name] C

## 2013-09-09 NOTE — Discharge Instructions (Signed)
Walk As Tolerated utilizing back precautions.  No bending, twisting, or lifting.  No driving for 2 weeks.   °Aquacel dressing may remain in place for 7 days. May shower with aquacel dressing in place. After 7 days, remove aquacel dressing and place gauze and tape dressing which should be kept clean and dry and changed daily. Do not remove steri-strips if they are present. °See Dr. Treyon Wymore in office in 10 to 14 days. Begin taking aspirin 81mg per day starting 4 days after your surgery if not allergic to aspirin or on another blood thinner. °Walk daily even outside. Use a cane or walker only if necessary. °Avoid sitting on soft sofas. ° °

## 2013-09-09 NOTE — Transfer of Care (Signed)
Immediate Anesthesia Transfer of Care Note  Patient: Randy Buchanan  Procedure(s) Performed: Procedure(s): MICRODISCECTOMY  LUMBAR FOUR TO LUMBAR FIVE  (N/A)  Patient Location: PACU  Anesthesia Type:General  Level of Consciousness: awake, alert  and oriented  Airway & Oxygen Therapy: Patient Spontanous Breathing and Patient connected to face mask oxygen  Post-op Assessment: Report given to PACU RN and Post -op Vital signs reviewed and stable  Post vital signs: Reviewed and stable  Complications: No apparent anesthesia complications

## 2013-09-09 NOTE — Brief Op Note (Signed)
09/09/2013  12:52 PM  PATIENT:  Randy Buchanan  33 y.o. 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   Delay start of Pharmacological VTE agent (>24hrs) due to surgical blood loss or risk of bleeding: yes

## 2013-09-10 ENCOUNTER — Encounter (HOSPITAL_COMMUNITY): Payer: Self-pay | Admitting: Specialist

## 2013-09-10 MED ORDER — METHOCARBAMOL 500 MG PO TABS
500.0000 mg | ORAL_TABLET | Freq: Four times a day (QID) | ORAL | Status: DC | PRN
  Administered 2013-09-10: 500 mg via ORAL
  Filled 2013-09-10: qty 1

## 2013-09-10 NOTE — Evaluation (Signed)
Physical Therapy Evaluation Patient Details Name: Randy Buchanan MRN: [id number] DOB: [birth year] Today's Date: 09/10/2013   History of Present Illness  Pt is s/p microlumbar decompression, L4-5, left and foraminotomies, L4 and L5. Pt with PMH for HTN.  Clinical Impression  On eval, pt was supervision level for mobility-able to ambulate ~150 feet. Practiced ambulation with/without device. Pt felt better with use of cane. He will consider purchasing cane from local store at his leisure. Reviewed back precautions and car transfer. Recommended pt perform seated/supine DF and PF as tolerated. No further questions from pt/wife. All education completed. Ready to d/c from PT standpoint.     Follow Up Recommendations No PT follow up    Equipment Recommendations  Gilmer Mor (pt states he will consider purchasing one at local store)    Recommendations for Other Services       Precautions / Restrictions Precautions Precautions: Back Precaution Booklet Issued: Yes (comment) Precaution Comments: Pt able to recall 3 back precautions. Educated on no lifting. reviewed all precautions again.  Restrictions Weight Bearing Restrictions: No      Mobility  Bed Mobility               General bed mobility comments: pt sitting in recliner  Transfers Overall transfer level: Modified independent                  Ambulation/Gait Ambulation/Gait assistance: Supervision Ambulation Distance (Feet): 150 Feet (x1 without device; 60 feet x 1 with cane) Assistive device: Straight cane Gait Pattern/deviations: Step-to pattern;Decreased stride length;Decreased dorsiflexion - left     General Gait Details: slightly unsteady but no LOB.   Stairs Stairs: Yes Stairs assistance: Min assist Stair Management: Step to pattern;Forwards Number of Stairs: 4 General stair comments: Wife provided 1 HHA. VCs safety, technique, seqeunce.   Wheelchair Mobility    Modified Randy (Stroke Patients Only)        Balance                                             Pertinent Vitals/Pain 2/10 back at rest; 4-5/10 with activity.     Home Living Family/patient expects to be discharged to:: Private residence Living Arrangements: Spouse/significant other Available Help at Discharge: Family Type of Home: House Home Access: Stairs to enter Entrance Stairs-Rails: None Secretary/administrator of Steps: 3 Home Layout: One level Home Equipment: None Additional Comments: pt checking to see if he can borrow a 3in1    Prior Function Level of Independence: Independent               Hand Dominance        Extremity/Trunk Assessment   Upper Extremity Assessment: Defer to OT evaluation           Lower Extremity Assessment: LLE deficits/detail   LLE Deficits / Details: DF 3-/5. Pt reports numbness L foot  Cervical / Trunk Assessment: Normal  Communication   Communication: No difficulties  Cognition Arousal/Alertness: Awake/alert Behavior During Therapy: WFL for tasks assessed/performed Overall Cognitive Status: Within Functional Limits for tasks assessed                      General Comments      Exercises        Assessment/Plan    PT Assessment Patent does not need any further PT services  PT Diagnosis  PT Problem List    PT Treatment Interventions     PT Goals (Current goals can be found in the Care Plan section) Acute Rehab PT Goals Patient Stated Goal: less pain. regain independence.  PT Goal Formulation: No goals set, d/c therapy    Frequency     Barriers to discharge        Co-evaluation               End of Session   Activity Tolerance: Patient limited by pain Patient left: with call bell/phone within reach;with family/visitor present (standing in room with wife)      Functional Assessment Tool Used: clinical judgement Functional Limitation: Mobility: Walking and moving around Mobility: Walking and Moving Around  Current Status ([id number](G8978): At least 1 percent but less than 20 percent impaired, limited or restricted Mobility: Walking and Moving Around Goal Status [phone](G8979): At least 1 percent but less than 20 percent impaired, limited or restricted Mobility: Walking and Moving Around Discharge Status [phone](G8980): At least 1 percent but less than 20 percent impaired, limited or restricted    Time: [id number] PT Time Calculation (min): 15 min   Charges:   PT Evaluation $Initial PT Evaluation Tier I: 1 Procedure PT Treatments $Gait Training: 8-22 mins   PT G Codes:   Functional Assessment Tool Used: clinical judgement Functional Limitation: Mobility: Walking and moving around    R.R. DonnelleyJannie Chaela Branscum, MPT Pager: [phone]

## 2013-09-10 NOTE — Progress Notes (Signed)
Subjective: 1 Day Post-Op Procedure(s) (LRB): MICRODISCECTOMY  LUMBAR FOUR TO LUMBAR FIVE  (N/A) Patient reports pain as mild.  Seen in AM rounds by myself and Dr. Shelle Iron. Noting incisional back pain. Already noting improvement in numbness and weakness L leg. OOB with PT during rounds, dorsiflexion improved, no foot drop. Voiding without difficulty. No other c/o.  Objective: Vital signs in last 24 hours: Temp:  [96.8 F (36 C)-98.1 F (36.7 C)] 98.1 F (36.7 C) (06/11 0118) Pulse Rate:  [57-89] 78 (06/11 0118) Resp:  [10-17] 17 (06/11 0118) BP: (113-151)/(66-86) 138/71 mmHg (06/11 0118) SpO2:  [98 %-100 %] 98 % (06/11 0118) Weight:  [84.369 kg (186 lb)] 84.369 kg (186 lb) (06/10 1415)  Intake/Output from previous day: 06/10 0701 - 06/11 0700 In: 2840 [P.O.:760; I.V.:1980; IV Piggyback:100] Out: 850 [Urine:800; Blood:50] Intake/Output this shift: Total I/O In: 280 [P.O.:280] Out: -   No results found for this basename: HGB,  in the last 72 hours No results found for this basename: WBC, RBC, HCT, PLT,  in the last 72 hours No results found for this basename: NA, K, CL, CO2, BUN, CREATININE, GLUCOSE, CALCIUM,  in the last 72 hours No results found for this basename: LABPT, INR,  in the last 72 hours  Neurologically intact ABD soft Neurovascular intact Sensation intact distally Intact pulses distally Dorsiflexion/Plantar flexion intact Incision: dressing C/D/I and no drainage No cellulitis present Compartment soft no calf pain or sign of DVT  Assessment/Plan: 1 Day Post-Op Procedure(s) (LRB): MICRODISCECTOMY  LUMBAR FOUR TO LUMBAR FIVE  (N/A) Up with therapy Advance diet Plan D/C home today after PT Discussed D/C instructions, Lspine precautions, dressing instructions Follow up 10-14 days for suture removal  Randy Buchanan M. 09/10/2013, 9:58 AM

## 2013-09-10 NOTE — Progress Notes (Signed)
09/10/13 1052  OT G-codes **NOT FOR INPATIENT CLASS**  Functional Assessment Tool Used clinical judgement  Functional Limitation Self care  Self Care Current Status [phone]) CI  Self Care Goal Status (B2620) CI  Self Care Discharge Status [phone]) CI  Judithann Sauger OTR/L [phone] 09/10/2013

## 2013-09-10 NOTE — Evaluation (Signed)
Occupational Therapy Evaluation Patient Details Name: Randy MechBrad Osias MRN: [id number] DOB: 1981/01/02 Today's Date: 09/10/2013    History of Present Illness Pt is s/p microlumbar decompression, L4-5, left and foraminotomies, L4 and L5. Pt with PMH for HTN.   Clinical Impression   Pt overall at supervision level and min assist with LB self care without AE for LB bathing. He practiced with the reacher for donning LB clothing. All education completed and back precautions reviewed and reinforced during session.     Follow Up Recommendations  No OT follow up;Supervision - Intermittent    Equipment Recommendations  3 in 1 bedside comode    Recommendations for Other Services       Precautions / Restrictions Precautions Precautions: Back Precaution Booklet Issued: Yes (comment) Precaution Comments: Pt able to recall 3 back precautions. Educated on no lifting. reviewed all precautions again.  Restrictions Weight Bearing Restrictions: No      Mobility Bed Mobility Overal bed mobility: Needs Assistance Bed Mobility: Rolling;Sidelying to Sit Rolling: Supervision Sidelying to sit: Supervision       General bed mobility comments: verbal cues for log roll technique.  Transfers Overall transfer level: Needs assistance Equipment used: None Transfers: Sit to/from Stand Sit to Stand: Supervision         General transfer comment: verbal cues for technique.    Balance                                            ADL Overall ADL's : Needs assistance/impaired Eating/Feeding: Independent;Sitting   Grooming: Wash/dry hands;Supervision/safety;Standing   Upper Body Bathing: Set up;Sitting   Lower Body Bathing: Minimal assistance;Sit to/from stand Lower Body Bathing Details (indicate cue type and reason): without AE Upper Body Dressing : Set up;Supervision/safety;Standing   Lower Body Dressing: Supervision/safety;Sit to/from stand Lower Body Dressing Details  (indicate cue type and reason): with AE to don underwear and shorts. Toilet Transfer: Supervision/safety;Ambulation;BSC   Toileting- ArchitectClothing Manipulation and Hygiene: Supervision/safety;Sit to/from stand   Tub/ Shower Transfer: Supervision/safety;Tub transfer     General ADL Comments: Reviewed all back precautions with pt and spouse. Pt not able to cross LEs up to don pants, underwear. Wife will be returning to work after the weekend so educated on AE options and coverage. Pt practiced wtih reacher to don underwear. Demonstrated sock aid for pt, shoe horn and long handle sponge. Pt will benefit from 3in1 as he doesnt have UE support to hold to to ascend and descend to toilet. Reviewed back care handout with pt and spouse.      Vision                     Perception     Praxis      Pertinent Vitals/Pain Pt didn't rate but states "its there" reposition, rest     Hand Dominance     Extremity/Trunk Assessment Upper Extremity Assessment Upper Extremity Assessment: Defer to OT evaluation      Cervical / Trunk Assessment Cervical / Trunk Assessment: Normal   Communication Communication Communication: No difficulties   Cognition Arousal/Alertness: Awake/alert Behavior During Therapy: WFL for tasks assessed/performed Overall Cognitive Status: Within Functional Limits for tasks assessed                     General Comments       Exercises  Shoulder Instructions      Home Living Family/patient expects to be discharged to:: Private residence Living Arrangements: Spouse/significant other Available Help at Discharge: Family Type of Home: House Home Access: Stairs to enter Secretary/administrator of Steps: 3 Entrance Stairs-Rails: None Home Layout: One level     Bathroom Shower/Tub: Chief Strategy Officer: Standard     Home Equipment: None   Additional Comments: pt checking to see if he can borrow a 3in1      Prior  Functioning/Environment Level of Independence: Independent             OT Diagnosis:     OT Problem List:     OT Treatment/Interventions:      OT Goals(Current goals can be found in the care plan section) Acute Rehab OT Goals Patient Stated Goal: less pain. regain independence.   OT Frequency:     Barriers to D/C:            Co-evaluation              End of Session    Activity Tolerance: Patient tolerated treatment well Patient left: in chair;with call bell/phone within reach;with family/visitor present   Time: 1017-5102 OT Time Calculation (min): 26 min Charges:  OT General Charges $OT Visit: 1 Procedure OT Evaluation $Initial OT Evaluation Tier I: 1 Procedure OT Treatments $Self Care/Home Management : 8-22 mins $Therapeutic Activity: 8-22 mins G-Codes:    Lennox Laity [phone] 09/10/2013, 10:53 AM

## 2013-09-10 NOTE — Progress Notes (Signed)
RN went through discharge instructions, prescriptions and demonstrated dressing changes. Pt was able to verbalize understanding and teach back. Patient and wife had no questions. Pt denied pain at this time and left unit in a wheelchair. Pt discharged home.

## 2013-09-10 NOTE — Op Note (Signed)
NAMCarney Buchanan:  Roller, Mendel                 ACCOUNT NO.:  [id number]  MEDICAL RECORD NO.:  [id number]  LOCATION:  1602                         [facility]  PHYSICIAN:  Jene EveryJeffrey Taaliyah Delpriore, M.D.    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  POSTOPERATIVE DIAGNOSES:  Spinal stenosis, herniated nucleus pulposus, L4-5, left; spondylolysis, L5, left.  PROCEDURES PERFORMED: 1. Microlumbar decompression, L4-5, left. 2. Foraminotomies, L4 and L5. 3. Retrieval of extruded fragment, L4-5, left.  ANESTHESIA:  General.  ASSISTANT:  Lanna PocheJacqueline Bissell, PA-C  HISTORY:  A [age] year old with extruded fragment, L5, with footdrop. Preoperatively evaluated, assumed and appeared to have a spondylolysis at L5-S1, L4-5, as well as the disk herniation was migrating caudad.  It was indicated for decompression.  Risk and benefits discussed including bleeding, infection, damage to neurovascular structures, DVT, PE, anesthetic complications, persistent footdrop, need for fusion in the future, etc.  TECHNIQUE:  With the patient in supine position, after the induction of adequate general anesthesia, 2 g Kefzol, he was placed prone on the [place] frame.  All bony prominences were well padded.  Lumbar region was prepped and draped in usual sterile fashion.  Two 18-gauge spinal needle was utilized to localize L4-5 interspace, confirmed with x-ray. Incision was made from the spinous process, L4-5.  Subcutaneous tissue was dissected.  Electrocautery was utilized to achieve hemostasis.  We divided the dorsolumbar fascia, elevated the paraspinous musculature, L4 and L5.  Obtained a confirmatory radiograph.  Hemilaminotomy of the caudad edge of L4 was performed with a 2-mm Kerrison to detach the ligamentum flavum.  Ligamentum flavum  was detached from the cephalad edge of L5 with a straight curette.  Care was taken not to enter the pars.  After ligamentum removed from the interspace, there was a small synovial cyst that was removed, some fiber cartilaginous material that was extruding into the foramen of L4.  This was removed with a minimal foraminotomy.  The remainder of the fibrocartilaginous adhesion of the pars was intact.  We performed a foraminotomy of L4, decompressing the lateral recess in the medial border of the pedicle.  I identified the L5 root, gently mobilized it medially as it was compressed in the lateral recess previously.  A focal extruded HNP was noted.  It was removed with a micropituitary tracking back to the disk space.  A small portion from the disk space removed.  Bipolar electrocautery was utilized to achieve hemostasis.  I then had 1 cm of excursion of the L5 root medial to the pedicle.  I probed, passed freely at the foramen of L5 and L4 without compression, checked beneath the thecal sac, the axilla of the root, the shoulder of the root.  No neural compression was noted, freely mobile, but it was erythematous and edematous, but intact.  Copiously irrigated the wound.  No active bleeding or CSF leakage.  Then, removed the [facility] retractor after confirmatory radiograph obtained.  Closed the fascia with 1 Vicryl, subcu with 2, and skin subcuticular Prolene.  Sterile dressing applied.  Placed supine on the hospital bed, extubated without difficulty, and transported to the recovery room in satisfactory condition.  The patient tolerated the procedure well.  No complications.  Minimal blood loss.  Lanna Poche, PA-C's assistance was used for closure, gentle intermittent neural traction on the microscope, patient positioning, etc., was necessary.     Jene Every, M.D.     Cordelia Pen  D:  09/09/2013  T:  09/09/2013  Job:  [id number]

## 2013-09-10 NOTE — Discharge Summary (Signed)
Physician Discharge Summary   Patient ID: Randy Buchanan MRN: [id number] DOB/AGE: October 15, 1980 33 y.o.  Admit date: 09/09/2013 Discharge date: 09/10/2013  Primary Diagnosis:   HERNIATED NUCLEUS PULPOSIS LUMBAR FOUR TO LUMBAR FIVE    Admission Diagnoses:  Past Medical History  Diagnosis Date  . Hypertension     no meds at present  . HNP (herniated nucleus pulposus), lumbar    Discharge Diagnoses:   Active Problems:   HNP (herniated nucleus pulposus), lumbar  Procedure:  Procedure(s) (LRB): MICRODISCECTOMY  LUMBAR FOUR TO LUMBAR FIVE  (N/A)   Consults: None  HPI:  see H&P    Laboratory Data: Hospital Outpatient Visit on 09/03/2013  Component Date Value Ref Range Status  . Sodium 09/03/2013 138  137 - 147 mEq/L Final  . Potassium 09/03/2013 4.2  3.7 - 5.3 mEq/L Final  . Chloride 09/03/2013 101  96 - 112 mEq/L Final  . CO2 09/03/2013 26  19 - 32 mEq/L Final  . Glucose, Bld 09/03/2013 94  70 - 99 mg/dL Final  . BUN 09/03/2013 16  6 - 23 mg/dL Final  . Creatinine, Ser 09/03/2013 0.75  0.50 - 1.35 mg/dL Final  . Calcium 09/03/2013 10.0  8.4 - 10.5 mg/dL Final  . GFR calc non Af Amer 09/03/2013 >90  >90 mL/min Final  . GFR calc Af Amer 09/03/2013 >90  >90 mL/min Final   Comment: (NOTE)                          The eGFR has been calculated using the CKD EPI equation.                          This calculation has not been validated in all clinical situations.                          eGFR's persistently <90 mL/min signify possible Chronic Kidney                          Disease.  . WBC 09/03/2013 5.0  4.0 - 10.5 K/uL Final  . RBC 09/03/2013 4.98  4.22 - 5.81 MIL/uL Final  . Hemoglobin 09/03/2013 15.4  13.0 - 17.0 g/dL Final  . HCT 09/03/2013 42.8  39.0 - 52.0 % Final  . MCV 09/03/2013 85.9  78.0 - 100.0 fL Final  . MCH 09/03/2013 30.9  26.0 - 34.0 pg Final  . MCHC 09/03/2013 36.0  30.0 - 36.0 g/dL Final  . RDW 09/03/2013 12.1  11.5 - 15.5 % Final  . Platelets 09/03/2013 295   150 - 400 K/uL Final  . MRSA, PCR 09/03/2013 NEGATIVE  NEGATIVE Final  . Staphylococcus aureus 09/03/2013 POSITIVE* NEGATIVE Final   Comment:                                 The Xpert SA Assay (FDA                          approved for NASAL specimens                          in patients over 74 years of age),  is one component of                          a comprehensive surveillance                          program.  Test performance has                          been validated by [facility] for patients greater                          than or equal to 44 year old.                          It is not intended                          to diagnose infection nor to                          guide or monitor treatment.   No results found for this basename: HGB,  in the last 72 hours No results found for this basename: WBC, RBC, HCT, PLT,  in the last 72 hours No results found for this basename: NA, K, CL, CO2, BUN, CREATININE, GLUCOSE, CALCIUM,  in the last 72 hours No results found for this basename: LABPT, INR,  in the last 72 hours  X-Rays:Dg Chest 2 View  09/03/2013   CLINICAL DATA:  Preop lumbar decompression.  EXAM: CHEST  2 VIEW  COMPARISON:  None.  FINDINGS: The heart size and mediastinal contours are within normal limits. Both lungs are clear. The visualized skeletal structures are unremarkable.  IMPRESSION: No active cardiopulmonary disease.   Electronically Signed   By: Marin Olp M.D.   On: 09/03/2013 09:56   Dg Lumbar Spine 2-3 Views  09/03/2013   CLINICAL DATA:  Preoperative evaluation for lumbar decompression  EXAM: LUMBAR SPINE - 2-3 VIEW  COMPARISON:  None.  FINDINGS: Five lumbar type vertebral bodies are well visualized. A very mild scoliosis concave to the right is noted in the mid lumbar spine. This may be somewhat positional in nature. Mild osteophytic changes are noted at L3-4 and posteriorly at L4-5. Films are numbered for  a surgical localization.  IMPRESSION: Mild degenerative change.   Electronically Signed   By: Inez Catalina M.D.   On: 09/03/2013 10:04   Dg Spine Portable 1 View  09/09/2013   CLINICAL DATA:  Intra operative probe localization.  EXAM: PORTABLE SPINE - 1 VIEW  COMPARISON:  09/09/2013  FINDINGS: As requested the spinous process ease have been labeled. There are tissue spreaders posterior to L5. Two surgical probes are present. One of probe is in the projection of the L5 pedicle in the other probe is directed towards the L4-5 disc space.  IMPRESSION: 1. Intraoperative surgical probe localization as above.   Electronically Signed   By: Kerby Moors M.D.   On: 09/09/2013 13:32   Dg Spine Portable 1 View  09/09/2013   CLINICAL DATA:  Lumbar spine surgery.  EXAM: PORTABLE SPINE - 1 VIEW  COMPARISON:  09/09/2013  FINDINGS: Surgical marker along the posterior aspect of L5. Mild disc space narrowing at L5-S1. Endplate degenerative changes along the inferior aspect of L4.  IMPRESSION: Surgical marking at L5.   Electronically Signed   By: Markus Daft M.D.   On: 09/09/2013 12:48   Dg Spine Portable 1 View  09/09/2013   CLINICAL DATA:  Intraoperative localization lateral film  EXAM: PORTABLE SPINE - 1 VIEW  COMPARISON:  AP and lateral view of the lumbar spine of September 03, 2013  FINDINGS: The lumbar vertebral bodies are preserved in height. The upper metallic localization device lies just posterior to the inferior aspect of the spinous process of L4. The lower metallic localization device lies posterior to the inferior aspect of the spinous process of L5.  IMPRESSION: The metallic localization devices are in place as described.   Electronically Signed   By: David  Martinique   On: 09/09/2013 12:25    EKG: Orders placed during the hospital encounter of 09/03/13  . EKG 12-LEAD  . EKG 12-LEAD     Hospital Course: Patient was admitted to [facility] and taken to the OR and underwent the above state procedure  without complications.  Patient tolerated the procedure well and was later transferred to the recovery room and then to the orthopaedic floor for postoperative care.  They were given PO and IV analgesics for pain control following their surgery.  They were given 24 hours of postoperative antibiotics.   PT was consulted postop to assist with mobility and transfers.  The patient was allowed to be WBAT with therapy and was taught back precautions. Discharge planning was consulted to help with postop disposition and equipment needs.  Patient had a good night on the evening of surgery and started to get up OOB with therapy on day one. Patient was seen in rounds and was ready to go home on day one.  They were given discharge instructions and dressing directions.  They were instructed on when to follow up in the office with Dr. Tonita Cong.   Diet: Regular diet Activity:WBAT; Lspine precautions Follow-up:in 10-14 days Disposition - Home Discharged Condition: good   Discharge Instructions   Call MD / Call 911    Complete by:  As directed   If you experience chest pain or shortness of breath, CALL 911 and be transported to the hospital emergency room.  If you develope a fever above 101 F, pus (white drainage) or increased drainage or redness at the wound, or calf pain, call your surgeon's office.     Constipation Prevention    Complete by:  As directed   Drink plenty of fluids.  Prune juice may be helpful.  You may use a stool softener, such as Colace (over the counter) 100 mg twice a day.  Use MiraLax (over the counter) for constipation as needed.     Diet - low sodium heart healthy    Complete by:  As directed      Increase activity slowly as tolerated    Complete by:  As directed             Medication List    STOP taking these medications       ketorolac 10 MG tablet  Commonly known as:  TORADOL      TAKE these medications       docusate sodium 100 MG capsule  Commonly known as:  COLACE    Take 1 capsule (100 mg total) by mouth  2 (two) times daily as needed for mild constipation.     methocarbamol 500 MG tablet  Commonly known as:  ROBAXIN  Take 1 tablet (500 mg total) by mouth 3 (three) times daily between meals as needed for muscle spasms.     oxyCODONE-acetaminophen 5-325 MG per tablet  Commonly known as:  PERCOCET  Take 1-2 tablets by mouth every 4 (four) hours as needed.           Follow-up Information   Follow up with BEANE,JEFFREY C, MD In 2 weeks.   Specialty:  Orthopedic Surgery   Contact information:   [address] [place] [address] [phone]       Signed: Arlee Muslim, PA-C Orthopaedic Surgery 09/10/2013, 10:01 AM

## 2015-06-02 ENCOUNTER — Other Ambulatory Visit: Payer: Self-pay | Admitting: Family Medicine

## 2015-06-02 ENCOUNTER — Telehealth: Payer: Self-pay | Admitting: *Deleted

## 2015-06-02 MED ORDER — OSELTAMIVIR PHOSPHATE 75 MG PO CAPS: 75.0000 mg | ORAL_CAPSULE | Freq: Every day | ORAL | Status: DC

## 2015-06-02 NOTE — Telephone Encounter (Signed)
Patient called and stated his wife is in labor. She just tested positive for Flu A. They recommend that he get prophalactic rx for prevention. [facility] has agreed to send.

## 2016-01-19 ENCOUNTER — Ambulatory Visit (INDEPENDENT_AMBULATORY_CARE_PROVIDER_SITE_OTHER): Payer: Managed Care, Other (non HMO) | Admitting: Family Medicine

## 2016-01-19 ENCOUNTER — Encounter: Payer: Self-pay | Admitting: Family Medicine

## 2016-01-19 ENCOUNTER — Ambulatory Visit
Admission: RE | Admit: 2016-01-19 | Discharge: 2016-01-19 | Disposition: A | Discharge status: Home / Self Care | Payer: Managed Care, Other (non HMO) | Source: Ambulatory Visit | Attending: Family Medicine | Admitting: Family Medicine

## 2016-01-19 VITALS — BP 140/79 | HR 89 | Temp 97.9°F | Resp 16 | Ht 69.0 in | Wt 203.0 lb

## 2016-01-19 DIAGNOSIS — J40 Bronchitis, not specified as acute or chronic: Secondary | ICD-10-CM | POA: Diagnosis not present

## 2016-01-19 DIAGNOSIS — R05 Cough: Secondary | ICD-10-CM | POA: Insufficient documentation

## 2016-01-19 DIAGNOSIS — M5432 Sciatica, left side: Secondary | ICD-10-CM | POA: Insufficient documentation

## 2016-01-19 DIAGNOSIS — B9789 Other viral agents as the cause of diseases classified elsewhere: Secondary | ICD-10-CM | POA: Diagnosis not present

## 2016-01-19 DIAGNOSIS — R0789 Other chest pain: Secondary | ICD-10-CM

## 2016-01-19 DIAGNOSIS — J069 Acute upper respiratory infection, unspecified: Secondary | ICD-10-CM | POA: Diagnosis not present

## 2016-01-19 DIAGNOSIS — R058 Other specified cough: Secondary | ICD-10-CM

## 2016-01-19 MED ORDER — PREDNISONE 20 MG PO TABS: 40.0000 mg | ORAL_TABLET | Freq: Every day | ORAL | 0 refills | Status: DC

## 2016-01-19 MED ORDER — DM-GUAIFENESIN ER 30-600 MG PO TB12: 1.0000 | ORAL_TABLET | Freq: Two times a day (BID) | ORAL | 0 refills | Status: DC

## 2016-01-19 MED ORDER — ALBUTEROL SULFATE HFA 108 (90 BASE) MCG/ACT IN AERS: 2.0000 | INHALATION_SPRAY | Freq: Four times a day (QID) | RESPIRATORY_TRACT | 0 refills | Status: DC | PRN

## 2016-01-19 MED ORDER — BENZONATATE 100 MG PO CAPS: 100.0000 mg | ORAL_CAPSULE | Freq: Three times a day (TID) | ORAL | 0 refills | Status: DC | PRN

## 2016-01-19 MED ORDER — AZITHROMYCIN 250 MG PO TABS: ORAL_TABLET | ORAL | 0 refills | Status: DC

## 2016-01-19 NOTE — Assessment & Plan Note (Signed)
Symptoms seem consistent with sciatica. No alarm symptoms today. Recommend OTC treatment with NSAIDs and return visit for full work-up to determine if imaging is needed. Pt can call his back surgeon to determine if they feel it is appropriate for him to be seen there.  Reviewed alarm symptoms- To ER if present. Return for persistent symptoms.

## 2016-01-19 NOTE — Patient Instructions (Signed)
You can use supportive care at home to help with your symptoms. I have sent Mucinex DM to your pharmacy to help break up the congestion and soothe your cough. You can takes this twice daily.  I have also sent tesslon perles to your pharmacy to help with the cough- you can take these 3 times daily as needed. Honey is a natural cough suppressant- so add it to your tea in the morning.  If you have a humidifer, set that up in your bedroom at night.   Please seek immediate medical attention if you develop shortness of breath not relieve by inhaler, chest pain/tightness, fever > 103 F or other concerning symptoms.    If you develop progressive numbness, weakness, loss of feeling in your pelvis, or loss of bowel/bladder control please seek immediate medical attention.

## 2016-01-19 NOTE — Progress Notes (Signed)
Subjective:    Patient ID: Randy Buchanan, male    DOB: [birth year], 35 y.o.   MRN: [id number]  HPI: Randy Buchanan is a 35 y.o. male presenting on 01/19/2016 for Nasal Congestion (onset yesterday little one is sick and he has chest congestion and cough alot Left ear pressure and facial pressure )   HPI  Pt presents for chest congestion, cough, nasal congestion. Symptoms began on Monday. Child is sick. No fever. Chest feels tight. Coughing up rust colored sputum. No shortness of breath.  Nasal congestion. Ear fullness. No sore throat.   Pt also reported some sciatica symptoms at the end of the visit. Had back surgery in 2015- was unsure if he needed to follow-up with his neurosurgeon. L sided numb/tingly pain radiating down the L leg to the ankle. Intermittent pain. No saddle anesthesia. No foot drop. No progressive weakness or numbness.   Past Medical History:  Diagnosis Date  . HNP (herniated nucleus pulposus), lumbar   . Hypertension    no meds at present    Current Outpatient Prescriptions on File Prior to Visit  Medication Sig  . docusate sodium (COLACE) 100 MG capsule Take 1 capsule (100 mg total) by mouth 2 (two) times daily as needed for mild constipation.  . methocarbamol (ROBAXIN) 500 MG tablet Take 1 tablet (500 mg total) by mouth 3 (three) times daily between meals as needed for muscle spasms.  Marland Kitchen oseltamivir (TAMIFLU) 75 MG capsule Take 1 capsule (75 mg total) by mouth daily.  Marland Kitchen oxyCODONE-acetaminophen (PERCOCET) 5-325 MG per tablet Take 1-2 tablets by mouth every 4 (four) hours as needed.   No current facility-administered medications on file prior to visit.     Review of Systems  Constitutional: Negative for chills and fever.  HENT: Positive for congestion, rhinorrhea and sneezing. Negative for ear pain and postnasal drip.   Respiratory: Positive for cough and chest tightness. Negative for shortness of breath and wheezing.   Cardiovascular: Negative for chest pain,  palpitations and leg swelling.  Musculoskeletal: Positive for back pain. Negative for neck pain and neck stiffness.  Skin: Negative.   Allergic/Immunologic: Positive for environmental allergies.  Neurological: Positive for numbness. Negative for headaches.   Per HPI unless specifically indicated above     Objective:    BP 140/79   Pulse 89   Temp 97.9 F (36.6 C) (Oral)   Resp 16   Ht 5\' 9"  (1.753 m)   Wt 203 lb (92.1 kg)   SpO2 99%   BMI 29.98 kg/m   Wt Readings from Last 3 Encounters:  01/19/16 203 lb (92.1 kg)  09/09/13 186 lb (84.4 kg)  09/03/13 186 lb (84.4 kg)    Physical Exam  Constitutional: He is oriented to person, place, and time. He appears well-developed and well-nourished. No distress.  HENT:  Head: Normocephalic and atraumatic.  Right Ear: Hearing and tympanic membrane normal. Tympanic membrane is not erythematous and not bulging.  Left Ear: Hearing normal. Tympanic membrane is retracted. Tympanic membrane is not erythematous and not bulging.  Nose: Mucosal edema and rhinorrhea present. No sinus tenderness or nasal septal hematoma. Right sinus exhibits no maxillary sinus tenderness and no frontal sinus tenderness. Left sinus exhibits no maxillary sinus tenderness and no frontal sinus tenderness.  Mouth/Throat: Uvula is midline and mucous membranes are normal. No uvula swelling. Posterior oropharyngeal erythema (mild.) present. No posterior oropharyngeal edema.  Neck: Neck supple. No Brudzinski's sign and no Kernig's sign noted.  Cardiovascular: Normal rate,  regular rhythm and normal heart sounds.   Pulmonary/Chest: No accessory muscle usage. No tachypnea. No respiratory distress. He has no decreased breath sounds. He has wheezes (with coughing) in the right lower field and the left lower field. He has no rhonchi. He has no rales. Chest wall is not dull to percussion. He exhibits no mass and no tenderness.  Musculoskeletal:       Lumbar back: He exhibits normal  range of motion, no tenderness, no pain and normal pulse.  Lymphadenopathy:    He has no cervical adenopathy.  Neurological: He is alert and oriented to person, place, and time. No cranial nerve deficit or sensory deficit. Gait normal.  Reflex Scores:      Patellar reflexes are 2+ on the right side and 2+ on the left side.  Results for orders placed or performed during the hospital encounter of 09/03/13  Surgical pcr screen  Result Value Ref Range   MRSA, PCR NEGATIVE NEGATIVE   Staphylococcus aureus POSITIVE (A) NEGATIVE  Basic metabolic panel  Result Value Ref Range   Sodium 138 137 - 147 mEq/L   Potassium 4.2 3.7 - 5.3 mEq/L   Chloride 101 96 - 112 mEq/L   CO2 26 19 - 32 mEq/L   Glucose, Bld 94 70 - 99 mg/dL   BUN 16 6 - 23 mg/dL   Creatinine, Ser 1.61 0.50 - 1.35 mg/dL   Calcium 09.6 8.4 - 04.5 mg/dL   GFR calc non Af Amer >90 >90 mL/min   GFR calc Af Amer >90 >90 mL/min  CBC  Result Value Ref Range   WBC 5.0 4.0 - 10.5 K/uL   RBC 4.98 4.22 - 5.81 MIL/uL   Hemoglobin 15.4 13.0 - 17.0 g/dL   HCT 40.9 81.1 - 91.4 %   MCV 85.9 78.0 - 100.0 fL   MCH 30.9 26.0 - 34.0 pg   MCHC 36.0 30.0 - 36.0 g/dL   RDW 78.2 95.6 - 21.3 %   Platelets 295 150 - 400 K/uL      Assessment & Plan:   Problem List Items Addressed This Visit    None    Visit Diagnoses    Bronchitis    -  Primary   R/o PNA with CXR.  Albuterol and prednisone to help with chest tightness and wheezing. Supportive care at home. Fill abx with fever or continued symptoms. Alarm symptoms reviewed.    Relevant Medications   dextromethorphan-guaiFENesin (MUCINEX DM) 30-600 MG 12hr tablet   albuterol (PROVENTIL HFA;VENTOLIN HFA) 108 (90 Base) MCG/ACT inhaler   predniSONE (DELTASONE) 20 MG tablet   azithromycin (ZITHROMAX) 250 MG tablet   Other Relevant Orders   DG Chest 2 View (Completed)   Viral upper respiratory tract infection       Symptoms likley viral in etiology. Supportive care at home. Alarm symptoms  reviwed. Return if symptoms not improving.    Relevant Medications   dextromethorphan-guaiFENesin (MUCINEX DM) 30-600 MG 12hr tablet   benzonatate (TESSALON) 100 MG capsule   azithromycin (ZITHROMAX) 250 MG tablet      Meds ordered this encounter  Medications  . dextromethorphan-guaiFENesin (MUCINEX DM) 30-600 MG 12hr tablet    Sig: Take 1 tablet by mouth 2 (two) times daily.    Dispense:  20 tablet    Refill:  0    Order Specific Question:   Supervising Provider    Answer:   Janeann Forehand [phone]  . benzonatate (TESSALON) 100 MG capsule    Sig:  Take 1 capsule (100 mg total) by mouth 3 (three) times daily as needed.    Dispense:  30 capsule    Refill:  0    Order Specific Question:   Supervising Provider    Answer:   Janeann ForehandHAWKINS JR, JAMES H [[id number]]  . albuterol (PROVENTIL HFA;VENTOLIN HFA) 108 (90 Base) MCG/ACT inhaler    Sig: Inhale 2 puffs into the lungs every 6 (six) hours as needed for wheezing or shortness of breath.    Dispense:  1 Inhaler    Refill:  0    Order Specific Question:   Supervising Provider    Answer:   Janeann ForehandHAWKINS JR, JAMES H [[id number]]  . predniSONE (DELTASONE) 20 MG tablet    Sig: Take 2 tablets (40 mg total) by mouth daily with breakfast.    Dispense:  10 tablet    Refill:  0    Order Specific Question:   Supervising Provider    Answer:   Janeann ForehandHAWKINS JR, JAMES H [phone][[id number]]  . azithromycin (ZITHROMAX) 250 MG tablet    Sig: Take 2 tablets today and 1 tablet daily until the bottle is empty.    Dispense:  6 tablet    Refill:  0    Order Specific Question:   Supervising Provider    Answer:   Janeann ForehandHAWKINS JR, JAMES H [[id number]]      Follow up plan: Return in about 4 weeks (around 02/16/2016), or if symptoms worsen or fail to improve, for Back issues. .Marland Kitchen

## 2016-10-10 ENCOUNTER — Encounter: Payer: Self-pay | Admitting: Family Medicine

## 2016-10-10 ENCOUNTER — Ambulatory Visit: Payer: Managed Care, Other (non HMO) | Admitting: Nurse Practitioner

## 2016-10-10 ENCOUNTER — Ambulatory Visit (INDEPENDENT_AMBULATORY_CARE_PROVIDER_SITE_OTHER): Payer: Managed Care, Other (non HMO) | Admitting: Family Medicine

## 2016-10-10 VITALS — BP 123/81 | HR 79 | Temp 98.3°F | Resp 16 | Ht 69.0 in | Wt 197.0 lb

## 2016-10-10 DIAGNOSIS — M722 Plantar fascial fibromatosis: Secondary | ICD-10-CM | POA: Diagnosis not present

## 2016-10-10 MED ORDER — NAPROXEN 500 MG PO TABS: 500.0000 mg | ORAL_TABLET | Freq: Two times a day (BID) | ORAL | 1 refills | Status: DC

## 2016-10-10 NOTE — Progress Notes (Signed)
Subjective:    Patient ID: Randy Buchanan, male    DOB: 10-23-1980, 36 y.o.   MRN: [id number]  Randy QuailBrad C Maqueda is a 36 y.o. male presenting on 10/10/2016 for Foot Pain (Left side onset yesterday pain with putting pressure on it)  Patient presents for a same day appointment. Previous PCP Amy Krebs FNP, he has not re-established at our office yet with new provider.  HPI   Left Heel Pain, Acute / Suspected Plantar Fasciitis: - Reports he has been well recently without any problem, until acute onset symptoms this morning of L heel pain. 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 He was able to ambulate cautiously, and wore flip flops to office visit, but still painful. Not worsening. - Did not take any meds today. Recently takes Ibuprofen OTC 200mg  (up to x 4 tabs for 800mg ) per dose daily PRN for back pain usually, not taking regular Tylenol - No prior history of left ankle or foot problem, no known fractures. 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 He was followed by Dr Jene [facility]Jeffrey Beane [facility]( Orthopedics) - Denies any recent injury, trauma, fall, redness, swelling, numbness, tingling  Social History  Substance Use Topics  . Smoking status: Former Smoker    Packs/day: 0.50    Years: 2.00    Types: Cigarettes  . Smokeless tobacco: Current User    Types: Snuff  . Alcohol use Yes     Comment: 5-6 beers in three day period    Review of Systems Per HPI  unless specifically indicated above     Objective:    BP 123/81   Pulse 79   Temp 98.3 F (36.8 C) (Oral)   Resp 16   Ht 5\' 9"  (1.753 m)   Wt 197 lb (89.4 kg)   BMI 29.09 kg/m   Wt Readings from Last 3 Encounters:  10/10/16 197 lb (89.4 kg)  01/19/16 203 lb (92.1 kg)  09/09/13 186 lb (84.4 kg)    Physical Exam  Constitutional: He is oriented to person, place, and time. He appears well-developed and well-nourished. No distress.  Well-appearing, comfortable, cooperative  HENT:  Head: Normocephalic and atraumatic.  Mouth/Throat: Oropharynx is clear and moist.  Eyes: Conjunctivae are normal. Right eye exhibits no discharge. Left eye exhibits no discharge.  Cardiovascular: Normal rate.   Pulmonary/Chest: Effort normal.  Musculoskeletal: He exhibits no edema.  Left Foot Inspection: normal appearance, without erythema, edema or ecchymosis Palpation: Localized significant tenderness posterior plantar medial heel and posterior heel with palpation, mild tender on back of heel. Non-tender on achilles tendon without deformity, lateral and medial malleoli or ligaments non tender, mid foot and forefoot non tender ROM: full active ROM, plantar flex/dorsiflex, ankle inv/ev Special Testing: Thompson test normal for achilles with movement without pain. Strength: 5/5 distal intact Neurovascular: distally intact  Gait mildly antalgic due to pain with left foot weight bearing  Neurological:  He is alert and oriented to person, place, and time.  Skin: Skin is warm and dry. No rash noted. He is not diaphoretic. No erythema.  Psychiatric: He has a normal mood and affect. His behavior is normal.  Well groomed, good eye contact, normal speech and thoughts  Nursing note and vitals reviewed.  Results for orders placed or performed during the hospital encounter of 09/03/13  Surgical pcr screen  Result Value Ref Range   MRSA, PCR NEGATIVE NEGATIVE   Staphylococcus aureus POSITIVE (A) NEGATIVE    Basic metabolic panel  Result Value Ref Range   Sodium 138 137 - 147 mEq/L   Potassium 4.2 3.7 - 5.3 mEq/L   Chloride 101 96 - 112 mEq/L   CO2 26 19 - 32 mEq/L   Glucose, Bld 94 70 - 99 mg/dL   BUN 16 6 - 23 mg/dL   Creatinine, Ser 6.04 0.50 - 1.35 mg/dL   Calcium 54.0 8.4 - 98.1 mg/dL   GFR calc non Af Amer >90 >90 mL/min   GFR calc Af Amer >90 >90 mL/min  CBC  Result Value Ref Range   WBC 5.0 4.0 - 10.5 K/uL   RBC 4.98 4.22 - 5.81 MIL/uL   Hemoglobin 15.4 13.0 - 17.0 g/dL   HCT 19.1 47.8 - 29.5 %   MCV 85.9 78.0 - 100.0 fL   MCH 30.9 26.0 - 34.0 pg   MCHC 36.0 30.0 - 36.0 g/dL   RDW 62.1 30.8 - 65.7 %   Platelets 295 150 - 400 K/uL      Assessment & Plan:   Problem List Items Addressed This Visit    Plantar fasciitis of left foot - Primary    Clinically consistent with acute L plantar fasciitis based on history and exam, localized pain. Risk factor with prolonged standing in boots at work - No known injury. Unlikely fracture based on history, no bony tenderness. - Considered achilles tendonitis but no overuse and location not consistent - Limited conservative therapy  Plan: 1. Reviewed diagnosis and management of plantar fasciitis 2. Stop ibuprofen OTC. Start rx NSAID trial - Naproxen 500mg  BID wc x 2-4 weeks then PRN 3. May take Tylenol PRN breakthrough 4. May use topical muscle rub, ice 5. Emphasized importance of relative rest, ice, and avoid prolonged standing and overuse 6. Handout given and explained appropriate stretching and home exercises important to do first thing in morning and later 7. Follow-up 4-6 weeks if not improved, consider referral to Ortho (may go to established [facility]) for consider MSK [place] and possible steroid injection. Will defer X-rays for now      Relevant Medications   naproxen (NAPROSYN) 500 MG tablet      Meds ordered this encounter  Medications  . naproxen (NAPROSYN) 500 MG tablet    Sig: Take 1 tablet (500 mg total) by  mouth 2 (two) times daily with a meal. For 2-4 weeks then as needed    Dispense:  60 tablet    Refill:  1    Follow up plan: Return in about 4 weeks (around 11/07/2016) for left plantar fasciitis.  Saralyn Pilar, DO [facility] 10/10/2016, 1:24 PM

## 2016-10-10 NOTE — Assessment & Plan Note (Signed)
Clinically consistent with acute L plantar fasciitis based on history and exam, localized pain. Risk factor with prolonged standing in boots at work - No known injury. Unlikely fracture based on history, no bony tenderness. - Considered achilles tendonitis but no overuse and location not consistent - Limited conservative therapy  Plan: 1. Reviewed diagnosis and management of plantar fasciitis 2. Stop ibuprofen OTC. Start rx NSAID trial - Naproxen 500mg  BID wc x 2-4 weeks then PRN 3. May take Tylenol PRN breakthrough 4. May use topical muscle rub, ice 5. Emphasized importance of relative rest, ice, and avoid prolonged standing and overuse 6. Handout given and explained appropriate stretching and home exercises important to do first thing in morning and later 7. Follow-up 4-6 weeks if not improved, consider referral to Ortho [facility] for consider MSK US and possible steroid injection. Will defer X-rays for now

## 2016-10-10 NOTE — Patient Instructions (Addendum)
Thank you for coming to the clinic today.  1.  You most likely have Plantar Fasciitis of L heel / foot, this seems to be in a current flare up  May have a bone spur of heel. Likely caused by prolonged standing in boots.  This is inflamed and will take time to resolve, but usually resolves after few days to weeks  Use RICE therapy: - R - Rest / relative rest with activity modification avoid overuse / prolonged stand - I - Ice packs (make sure you use a towel or sock / something to protect skin)  Recommend trial of Anti-inflammatory with Naproxen (Naprosyn) 500mg  tabs - take one with food and plenty of water TWICE daily every day (breakfast and dinner), for next 2 to 4 weeks, then you may take only as needed - DO NOT TAKE any ibuprofen, aleve, motrin while you are taking this medicine - It is safe to take Tylenol Ext Str 500mg  tabs - take 1 to 2 (max dose 1000mg ) every 6 hours as needed for breakthrough pain, max 24 hour daily dose is 6 to 8 tablets or 4000mg   May use topical muscle rub, icy hot, tiger balm  Usually for plantar fascia, tightens up overnight when off feet, and first steps in morning are worst.  If not improving after 1-2 weeks on medicine, and stretching exercises and some rest - then can contact [facility] to have second opinion.  Please schedule a Follow-up Appointment to: Return in about 4 weeks (around 11/07/2016) for left plantar fasciitis.  If you have any other questions or concerns, please feel free to call the clinic or send a message through MyChart. You may also schedule an earlier appointment if necessary.  Additionally, you may be receiving a survey about your experience at our clinic within a few days to 1 week by e-mail or mail. We value your feedback.  Saralyn Pilar, DO [facility], [facility]              Plantar Fascia Stretches / Exercises  See other page with pictures of each exercise.  Start with 1 or 2 of these  exercises that you are most comfortable with. Do not do any exercises that cause you significant worsening pain. Some of these may cause some "stretching soreness" but it should go away after you stop the exercise, and get better over time. Gradually increase up to 3-4 exercises as tolerated.  You may begin exercising the muscles of your foot right away by gently stretching them as follows:  Stretching: Towel stretch: Sit on a hard surface with your injured leg stretched out in front of you. Loop a towel around the ball of your foot and pull the towel toward your body keeping your knee straight. Hold this position for 15 to 30 seconds then relax. Repeat 3 times. When the towel stretch becomes to easy, you may begin doing the standing calf stretch.  Standing calf stretch: Facing a wall, put your hands against the wall at about eye level. Keep the injured leg back, the uninjured leg forward, and the heel of your injured leg on the floor. Turn your injured foot slightly inward (as if you were pigeon-toed) as you slowly lean into the wall until you feel a stretch in the back of your calf. Hold for 15 to 30 seconds. Repeat 3 times. Do this exercise several times each day. When you can stand comfortably on your injured foot, you can begin stretching the bottom of  your foot using the plantar fascia stretch.  Plantar fascia stretch: Stand with the ball of your injured foot on a stair. Reach for the bottom step with your heel until you feel a stretch in the arch of your foot. Hold this position for 15 to 30 seconds and then relax. Repeat 3 times. After you have stretched the bottom muscles of your foot, you can begin strengthening the top muscles of your foot.  Frozen can roll: Roll your bare injured foot back and forth from your heel to your mid-arch over a frozen juice can. Repeat for 3 to 5 minutes. This exercise is particularly helpful if done first thing in the morning. Towel pickup: With your heel on the  ground, pick up a towel with your toes. Release. Repeat 10 to 20 times. When this gets easy, add more resistance by placing a book or small weight on the towel. Static and dynamic balance exercises Place a chair next to your non-injured leg and stand upright. (This will provide you with balance if needed.) Stand on your injured foot. Try to raise the arch of your foot while keeping your toes on the floor. Try to maintain this position and balance on your injured side for 30 seconds. This exercise can be made more difficult by doing it on a piece of foam or a pillow, or with your eyes closed. Stand in the same position as above. Keep your foot in this position and reach forward in front of you with your injured side's hand, allowing your knee to bend. Repeat this 10 times while maintaining the arch height. This exercise can be made more difficult by reaching farther in front of you. Do 2 sets. Stand in the same position as above. While maintaining your arch height, reach the injured side's hand across your body toward the chair. The farther you reach, the more challenging the exercise. Do 2 sets of 10.  Next, you can begin strengthening the muscles of your foot and lower leg by using elastic tubing.  Strengthening: Resisted dorsiflexion: Sit with your injured leg out straight and your foot facing a doorway. Tie a loop in one end of the tubing. Put your foot through the loop so that the tubing goes around the arch of your foot. Tie a knot in the other end of the tubing and shut the knot in the door. Move backward until there is tension in the tubing. Keeping your knee straight, pull your foot toward your body, stretching the tubing. Slowly return to the starting position. Do 3 sets of 10. Resisted plantar flexion: Sit with your leg outstretched and loop the middle section of the tubing around the ball of your foot. Hold the ends of the tubing in both hands. Gently press the ball of your foot down and point  your toes, stretching the tubing. Return to the starting position. Do 3 sets of 10. Resisted inversion: Sit with your legs out straight and cross your uninjured leg over your injured ankle. Wrap the tubing around the ball of your injured foot and then loop it around your uninjured foot so that the tubing is anchored there at one end. Hold the other end of the tubing in your hand. Turn your injured foot inward and upward. This will stretch the tubing. Return to the starting position. Do 3 sets of 10. Resisted eversion: Sit with both legs stretched out in front of you, with your feet about a shoulder's width apart. Tie a loop in one end  of the tubing. Put your injured foot through the loop so that the tubing goes around the arch of that foot and wraps around the outside of the uninjured foot. Hold onto the other end of the tubing with your hand to provide tension. Turn your injured foot up and out. Make sure you keep your uninjured foot still so that it will allow the tubing to stretch as you move your injured foot. Return to the starting position. Do 3 sets of 10.

## 2016-11-14 ENCOUNTER — Other Ambulatory Visit: Payer: Self-pay

## 2016-11-14 DIAGNOSIS — M722 Plantar fascial fibromatosis: Secondary | ICD-10-CM

## 2016-11-14 MED ORDER — NAPROXEN 500 MG PO TABS: 500.0000 mg | ORAL_TABLET | Freq: Two times a day (BID) | ORAL | 0 refills | Status: DC

## 2016-11-23 ENCOUNTER — Encounter: Payer: Self-pay | Admitting: *Deleted

## 2016-11-23 ENCOUNTER — Emergency Department
Admission: EM | Admit: 2016-11-23 | Discharge: 2016-11-23 | Disposition: A | Discharge status: Home / Self Care | Payer: Managed Care, Other (non HMO) | Attending: Emergency Medicine | Admitting: Emergency Medicine

## 2016-11-23 ENCOUNTER — Emergency Department: Payer: Managed Care, Other (non HMO)

## 2016-11-23 DIAGNOSIS — Z79899 Other long term (current) drug therapy: Secondary | ICD-10-CM | POA: Insufficient documentation

## 2016-11-23 DIAGNOSIS — Y990 Civilian activity done for income or pay: Secondary | ICD-10-CM | POA: Diagnosis not present

## 2016-11-23 DIAGNOSIS — Z23 Encounter for immunization: Secondary | ICD-10-CM | POA: Diagnosis not present

## 2016-11-23 DIAGNOSIS — S31134A Puncture wound of abdominal wall without foreign body, left lower quadrant without penetration into peritoneal cavity, initial encounter: Secondary | ICD-10-CM | POA: Insufficient documentation

## 2016-11-23 DIAGNOSIS — S31139A Puncture wound of abdominal wall without foreign body, unspecified quadrant without penetration into peritoneal cavity, initial encounter: Secondary | ICD-10-CM

## 2016-11-23 DIAGNOSIS — I10 Essential (primary) hypertension: Secondary | ICD-10-CM | POA: Insufficient documentation

## 2016-11-23 DIAGNOSIS — Y929 Unspecified place or not applicable: Secondary | ICD-10-CM | POA: Insufficient documentation

## 2016-11-23 DIAGNOSIS — W458XXA Other foreign body or object entering through skin, initial encounter: Secondary | ICD-10-CM | POA: Insufficient documentation

## 2016-11-23 DIAGNOSIS — Y939 Activity, unspecified: Secondary | ICD-10-CM | POA: Diagnosis not present

## 2016-11-23 MED ORDER — TETANUS-DIPHTHERIA TOXOIDS TD 5-2 LFU IM INJ
0.5000 mL | INJECTION | Freq: Once | INTRAMUSCULAR | Status: AC
  Administered 2016-11-23: 0.5 mL via INTRAMUSCULAR
  Filled 2016-11-23: qty 0.5

## 2016-11-23 NOTE — Discharge Instructions (Signed)
° °  Keep area clean, dry, and bandage for 3-5 days.

## 2016-11-23 NOTE — ED Notes (Signed)
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

## 2016-11-23 NOTE — ED Provider Notes (Signed)
[facility] Emergency Department Provider Note   ____________________________________________   First MD Initiated Contact with Patient 11/23/16 1253     (approximate)  I have reviewed the triage vital signs and the nursing notes.   HISTORY  Chief Complaint Puncture Wound    HPI Randy Buchanan is a 36 y.o. male patient presents with a puncture wound to the left lower lateral abdomen prior to arrival. 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 Patient states status post moving towards wire that was pulsating bleeding was stopped abruptly with direct pressure. Patient applied Band-Aid and went to urgent care clinic. Patient was sent from urgent care for definitive evaluation and treatment. Patient rates his pain as a 5/10. Patient describes pain as "achy". Patient tetanus shot is not up-to-date.  Past Medical History:  Diagnosis Date  . HNP (herniated nucleus pulposus), lumbar   . Hypertension    no meds at present    Patient Active Problem List   Diagnosis Date Noted  . Plantar fasciitis of left foot 10/10/2016  . Left sided sciatica 01/19/2016  . HNP (herniated nucleus pulposus), lumbar 09/09/2013    Past Surgical History:  Procedure Laterality Date  . LUMBAR LAMINECTOMY/DECOMPRESSION MICRODISCECTOMY N/A 09/09/2013   Procedure: MICRODISCECTOMY  LUMBAR FOUR TO LUMBAR FIVE ;  Surgeon: Javier Docker, MD;  Location: [facility];  Service: Orthopedics;  Laterality: N/A;    Prior to Admission medications   Medication Sig Start Date End Date Taking? Authorizing Provider  albuterol (PROVENTIL HFA;VENTOLIN HFA) 108 (90 Base) MCG/ACT inhaler Inhale 2 puffs into the lungs every 6 (six) hours as needed for wheezing or shortness of breath. Patient not taking: Reported on 10/10/2016 01/19/16   Loura Pardon, NP  naproxen (NAPROSYN) 500 MG tablet Take 1 tablet (500 mg total) by mouth 2 (two) times daily with a meal. For 2-4 weeks then as needed 11/14/16    Smitty Cords, DO    Allergies Gabapentin  No family history on file.  Social History Social History  Substance Use Topics  . Smoking status: Former Smoker    Packs/day: 0.50    Years: 2.00    Types: Cigarettes  . Smokeless tobacco: Current User    Types: Snuff  . Alcohol use Yes     Comment: 5-6 beers in three day period    Review of Systems  Constitutional: No fever/chills Eyes: No visual changes. ENT: No sore throat. Cardiovascular: Denies chest pain. Respiratory: Denies shortness of breath. Gastrointestinal: Left lateral abdominal pain.  No nausea, no vomiting.  No diarrhea.  No constipation. Genitourinary: Negative for dysuria. Musculoskeletal: Negative for back pain. Skin: Negative for rash. Neurological: Negative for headaches, focal weakness or numbness. Allergic/Immunilogical: See medication list ____________________________________________   PHYSICAL EXAM:  VITAL SIGNS: ED Triage Vitals [11/23/16 1219]  Enc Vitals Group     BP (!) 176/86     Pulse Rate 66     Resp 20     Temp 98.4 F (36.9 C)     Temp Source Oral     SpO2 96 %     Weight 195 lb (88.5 kg)     Height 5\' 8"  (1.727 m)     Head Circumference      Peak Flow      Pain Score 5     Pain Loc      Pain Edu?      Excl. in GC?     Constitutional: Alert and oriented. Well  appearing and in no acute distress. Cardiovascular: Normal rate, regular rhythm. Grossly normal heart sounds.  Good peripheral circulation. Elevated blood pressure Respiratory: Normal respiratory effort.  No retractions. Lungs CTAB. Gastrointestinal: Soft and nontender. No distention. No abdominal bruits. No CVA tenderness. Musculoskeletal: No lower extremity tenderness nor edema.  No joint effusions. Neurologic:  Normal speech and language. No gross focal neurologic deficits are appreciated. No gait instability. Skin:  Skin is warm, dry and intact. No rash noted. Puncture wound left lateral lower  abdomen Psychiatric: Mood and affect are normal. Speech and behavior are normal.  ____________________________________________   LABS (all labs ordered are listed, but only abnormal results are displayed)  Labs Reviewed - No data to display ____________________________________________  EKG   ____________________________________________  RADIOLOGY  Ct Abdomen Wo Contrast  Result Date: 11/23/2016 CLINICAL DATA:  Acute puncture wound to the abdomen. EXAM: CT ABDOMEN WITHOUT CONTRAST TECHNIQUE: Multidetector CT imaging of the abdomen was performed following the standard protocol without IV contrast. COMPARISON:  None. FINDINGS: Lower chest: Normal. Hepatobiliary: No focal liver abnormality is seen. No gallstones, gallbladder wall thickening, or biliary dilatation. Pancreas: Unremarkable. No pancreatic ductal dilatation or surrounding inflammatory changes. Spleen: Normal in size without focal abnormality. Adrenals/Urinary Tract: Adrenal glands are unremarkable. Kidneys are normal, without renal calculi, focal lesion, or hydronephrosis. Stomach/Bowel: Stomach is within normal limits. Appendix appears normal. No evidence of bowel wall thickening, distention, or inflammatory changes. Vascular/Lymphatic: No significant vascular findings are present. No enlarged abdominal or pelvic lymph nodes. Other: Tiny umbilical hernia containing only fat, to the left of midline. Musculoskeletal: No acute or significant osseous findings. IMPRESSION: Negative exam. Electronically Signed   By: Francene Boyers M.D.   On: 11/23/2016 13:55    ____________________________________________   PROCEDURES  Procedure(s) performed: None  Procedures  Critical Care performed: No  ____________________________________________   INITIAL IMPRESSION / ASSESSMENT AND PLAN / ED COURSE  Pertinent labs & imaging results that were available during my care of the patient were reviewed by me and considered in my medical  decision making (see chart for details).  Puncture wound left lateral abdomen. Discuss CT findings with patient. Patient given discharge care instructions. Patient given a tetanus shot prior to departure. Patient advised follow-up with PCP as needed.      ____________________________________________   FINAL CLINICAL IMPRESSION(S) / ED DIAGNOSES  Final diagnoses:  Puncture wound of lateral abdominal wall, initial encounter      NEW MEDICATIONS STARTED DURING THIS VISIT:  New Prescriptions   No medications on file     Note:  This document was prepared using Dragon voice recognition software and may include unintentional dictation errors.    Joni Reining, PA-C 11/23/16 1413    Nita Sickle, MD 11/25/16 [phone]

## 2016-11-23 NOTE — ED Triage Notes (Signed)
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

## 2017-03-20 ENCOUNTER — Other Ambulatory Visit: Payer: Self-pay

## 2017-03-20 ENCOUNTER — Encounter: Payer: Self-pay | Admitting: Family Medicine

## 2017-03-20 ENCOUNTER — Ambulatory Visit: Payer: Managed Care, Other (non HMO) | Admitting: Family Medicine

## 2017-03-20 VITALS — BP 134/84 | HR 74 | Temp 98.5°F | Resp 16 | Ht 69.0 in | Wt 212.6 lb

## 2017-03-20 DIAGNOSIS — J40 Bronchitis, not specified as acute or chronic: Secondary | ICD-10-CM

## 2017-03-20 DIAGNOSIS — Z8739 Personal history of other diseases of the musculoskeletal system and connective tissue: Secondary | ICD-10-CM | POA: Diagnosis not present

## 2017-03-20 DIAGNOSIS — M545 Low back pain, unspecified: Secondary | ICD-10-CM

## 2017-03-20 DIAGNOSIS — M549 Dorsalgia, unspecified: Secondary | ICD-10-CM | POA: Insufficient documentation

## 2017-03-20 MED ORDER — NAPROXEN 500 MG PO TABS: 500.0000 mg | ORAL_TABLET | Freq: Two times a day (BID) | ORAL | 0 refills | Status: DC

## 2017-03-20 MED ORDER — ALBUTEROL SULFATE HFA 108 (90 BASE) MCG/ACT IN AERS: 2.0000 | INHALATION_SPRAY | Freq: Four times a day (QID) | RESPIRATORY_TRACT | 0 refills | Status: DC | PRN

## 2017-03-20 MED ORDER — BACLOFEN 10 MG PO TABS: 5.0000 mg | ORAL_TABLET | Freq: Three times a day (TID) | ORAL | 0 refills | Status: DC | PRN

## 2017-03-20 NOTE — Assessment & Plan Note (Signed)
S/p L4-5 lumbar microdiscectomy Dr [facility] 08/2013

## 2017-03-20 NOTE — Assessment & Plan Note (Signed)
Acute L LBP without associated sciatica, without chronic pain in intervals but has similar . Suspect likely due to muscle spasm/strain, without known injury or trauma. In setting of known DJD prior herniated disc L4-5 s/p surgery - No red flag symptoms. Negative SLR for radiculopathy - Inadequate conservative therapy  Plan: 1. Start anti-inflammatory trial with rx Naprosyn 500mg  BID wc x 2-4 weeks, then PRN 2. Start muscle relaxant with Baclofen 10mg  tabs - take 5-10mg  up to TID PRN, titrate up as tolerated 3. May use Tylenol PRN for breakthrough 4. Encouraged use of heating pad 1-2x daily for now then PRN 5.  RTC 1 month, re-evaluation. If not improved consider X-ray imaging given >6 weeks. Consider trial of PT for strengthening. Possible prednisone burst if sciatica. [facility] return [facility].

## 2017-03-20 NOTE — Patient Instructions (Addendum)
Thank you for coming to the clinic today.  1. For your Back Pain - I think that this is due to Muscle Spasms or strain. 2. Start with anti-inflammatory Naprosyn (Naproxen) 500mg  twice daily (12 hrs apart, with food, breakfast and dinner) every day for next 2 to 4 weeks if helping, then can use only as needed 3. Start Baclofen (Lioresal) - cut in half for 5mg  at night for muscle relaxant - may make you sedated or sleepy (be careful driving or working on this) if tolerated you can take every 8 hours, half or whole tab 4. May use Tylenol Extra Str 500mg  tabs - may take 1-2 tablets every 6 hours as needed 5. Recommend to start using heating pad on your lower back 1-2x daily for few weeks  This pain may take weeks to months to fully resolve, but hopefully it will respond to the medicine initially. All back injuries (small or serious) are slow to heal since we use our back muscles every day. Be careful with turning, twisting, lifting, sitting / standing for prolonged periods, and avoid re-injury.  If your symptoms significantly worsen with more pain, or new symptoms with weakness in one or both legs, new or different shooting leg pains, numbness in legs or groin, loss of control or retention of urine or bowel movements, please call back for advice and you may need to go directly to the Emergency Department.  Please schedule a Follow-up Appointment to: Return in about 4 weeks (around 04/17/2017) for Back Pain .  If you have any other questions or concerns, please feel free to call the clinic or send a message through MyChart. You may also schedule an earlier appointment if necessary.  Additionally, you may be receiving a survey about your experience at our clinic within a few days to 1 week by e-mail or mail. We value your feedback.  Saralyn PilarAlexander Yamen Castrogiovanni, DO [facility], [place]

## 2017-03-20 NOTE — Progress Notes (Signed)
Subjective:    Patient ID: Randy Buchanan, male    DOB: 1980/07/01, 36 y.o.   MRN: [id number]  Randy Buchanan is a 36 y.o. male presenting on 03/20/2017 for Back Pain (pt had surgery in past for back pain onset yesterday but today is worst)  Patient presents for a same day appointment.  HPI   LOW Left BACK PAIN, Acute on Chronic - Has a known history of prior back pain and acute injury with >3 years ago herniated lumbar disc, L4-5 microdiscectomy, by Dr Jene Every [facility]) - Reports symptoms acute on chronic back pain started about 1 day ago without inciting injury. Today seems to be worsening without improvement. Describes pain as dull aching mild to moderate at rest, and then intermittent worsening throbbing shooting stabbing, severity 7/10. No pain radiating to legs, has history of sciatica. - Not taking any medicine at the moment - Has taken Tylenol and Ibuprofen in past with relief, not started yet. In past he has received Toradol in past pill and shot - Prior similar back pain flares improved with rest, about 8 months ago had mild back pain flare, not sure what resolved it some ibuprofen maybe - Admits difficulty sleeping due to pain - Denies any fevers/chills, numbness, tingling, weakness, loss of control bladder/bowel incontinence or retention, unintentional wt loss, night sweats  Health Maintenance: Due for Flu Shot, declines today despite counseling on benefits  Depression screen PHQ 2/9 03/20/2017  Decreased Interest 0  Down, Depressed, Hopeless 0  PHQ - 2 Score 0    Social History   Tobacco Use  . Smoking status: Former Smoker    Packs/day: 0.50    Years: 2.00    Pack years: 1.00    Types: Cigarettes  . Smokeless tobacco: Current User    Types: Snuff  Substance Use Topics  . Alcohol use: Yes    Comment: 5-6 beers in three day period  . Drug use: No    Review of Systems Per HPI unless specifically indicated above     Objective:    BP 134/84    Pulse 74   Temp 98.5 F (36.9 C) (Oral)   Resp 16   Ht 5\' 9"  (1.753 m)   Wt 212 lb 9.6 oz (96.4 kg)   BMI 31.40 kg/m   Wt Readings from Last 3 Encounters:  03/20/17 212 lb 9.6 oz (96.4 kg)  11/23/16 195 lb (88.5 kg)  10/10/16 197 lb (89.4 kg)    Physical Exam  Constitutional: He is oriented to person, place, and time. He appears well-developed and well-nourished. No distress.  Well-appearing except mildly uncomfortable d/t back pain and stiffness, cooperative  HENT:  Head: Normocephalic and atraumatic.  Mouth/Throat: Oropharynx is clear and moist.  Eyes: Conjunctivae are normal. Right eye exhibits no discharge. Left eye exhibits no discharge.  Neck: Normal range of motion. Neck supple.  Cardiovascular: Normal rate, regular rhythm, normal heart sounds and intact distal pulses.  No murmur heard. Pulmonary/Chest: Effort normal.  Musculoskeletal: He exhibits no edema.  Low Back Inspection: Normal appearance, no spinal deformity, symmetrical. Palpation: No reproducible tenderness on palpation. But localized tenderness over lower lumbar spinous processes, and also L > R paraspinal lumbar muscles with significant hypertonicity/spasm. ROM: Reduced active ROM due to stiffness and pain, limited back extension and flexion, mostly intact rotation Special Testing: Seated SLR negative for radicular pain bilaterally but provoked reproduced pain localized Left low back bilateral testing Standing facet load test positive L back pain  Strength: Bilateral hip flex/ext 5/5, knee flex/ext 5/5, ankle dorsiflex/plantarflex 5/5 Neurovascular: intact distal sensation to light touch  Neurological: He is alert and oriented to person, place, and time.  Patellar DTR +1 intact symmetrical  Skin: Skin is warm and dry. No rash noted. He is not diaphoretic. No erythema.  Psychiatric: His behavior is normal.  Nursing note and vitals reviewed.  Results for orders placed or performed during the hospital encounter  of 09/03/13  Surgical pcr screen  Result Value Ref Range   MRSA, PCR NEGATIVE NEGATIVE   Staphylococcus aureus POSITIVE (A) NEGATIVE  Basic metabolic panel  Result Value Ref Range   Sodium 138 137 - 147 mEq/L   Potassium 4.2 3.7 - 5.3 mEq/L   Chloride 101 96 - 112 mEq/L   CO2 26 19 - 32 mEq/L   Glucose, Bld 94 70 - 99 mg/dL   BUN 16 6 - 23 mg/dL   Creatinine, Ser 1.190.75 0.50 - 1.35 mg/dL   Calcium 14.710.0 8.4 - 82.910.5 mg/dL   GFR calc non Af Amer >90 >90 mL/min   GFR calc Af Amer >90 >90 mL/min  CBC  Result Value Ref Range   WBC 5.0 4.0 - 10.5 K/uL   RBC 4.98 4.22 - 5.81 MIL/uL   Hemoglobin 15.4 13.0 - 17.0 g/dL   HCT 56.242.8 13.039.0 - 86.552.0 %   MCV 85.9 78.0 - 100.0 fL   MCH 30.9 26.0 - 34.0 pg   MCHC 36.0 30.0 - 36.0 g/dL   RDW 78.412.1 69.611.5 - 29.515.5 %   Platelets 295 150 - 400 K/uL      Assessment & Plan:   Problem List Items Addressed This Visit    Acute back pain - Primary    Acute L LBP without associated sciatica, without chronic pain in intervals but has similar . Suspect likely due to muscle spasm/strain, without known injury or trauma. In setting of known DJD prior herniated disc L4-5 s/p surgery - No red flag symptoms. Negative SLR for radiculopathy - Inadequate conservative therapy  Plan: 1. Start anti-inflammatory trial with rx Naprosyn 500mg  BID wc x 2-4 weeks, then PRN 2. Start muscle relaxant with Baclofen 10mg  tabs - take 5-10mg  up to TID PRN, titrate up as tolerated 3. May use Tylenol PRN for breakthrough 4. Encouraged use of heating pad 1-2x daily for now then PRN 5.  RTC 1 month, re-evaluation. If not improved consider X-ray imaging given >6 weeks. Consider trial of PT for strengthening. Possible prednisone burst if sciatica. [facility]      Relevant Medications   naproxen (NAPROSYN) 500 MG tablet   baclofen (LIORESAL) 10 MG tablet   History of herniated intervertebral disc    S/p L4-5 lumbar microdiscectomy Dr [facility]  08/2013         Meds ordered this encounter  Medications  . naproxen (NAPROSYN) 500 MG tablet    Sig: Take 1 tablet (500 mg total) by mouth 2 (two) times daily with a meal. For 2-4 weeks then as needed    Dispense:  180 tablet    Refill:  0  . baclofen (LIORESAL) 10 MG tablet    Sig: Take 0.5-1 tablets (5-10 mg total) by mouth 3 (three) times daily as needed for muscle spasms.    Dispense:  30 each    Refill:  0    Follow up plan: Return in about 4 weeks (around 04/17/2017) for Back Pain .  Saralyn PilarAlexander Karamalegos, DO [facility] 03/20/2017, 11:55 AM

## 2017-06-07 ENCOUNTER — Telehealth: Payer: Self-pay | Admitting: Family Medicine

## 2017-06-07 DIAGNOSIS — Z20828 Contact with and (suspected) exposure to other viral communicable diseases: Secondary | ICD-10-CM

## 2017-06-07 MED ORDER — OSELTAMIVIR PHOSPHATE 75 MG PO CAPS
75.0000 mg | ORAL_CAPSULE | Freq: Every day | ORAL | 0 refills | Status: DC
Start: 2017-06-07 — End: 2018-04-23

## 2017-06-07 NOTE — Telephone Encounter (Signed)
Called patient back, he does not have symptoms of the Flu. He has some mild congestion only, afebrile and no body aches or other concerns, no cough.  Sent rx prophylaxis dose, advised if symptoms develop of flu he is to take twice daily until finished.  Return precautions given  Saralyn PilarAlexander Karamalegos, DO [facility] 06/07/2017, 12:46 PM

## 2017-06-07 NOTE — Telephone Encounter (Signed)
Pt has 2 small children that were diagnosed with flu.  [facility] [phone]

## 2018-04-23 ENCOUNTER — Encounter: Payer: Self-pay | Admitting: Family Medicine

## 2018-04-23 ENCOUNTER — Ambulatory Visit (INDEPENDENT_AMBULATORY_CARE_PROVIDER_SITE_OTHER): Payer: Managed Care, Other (non HMO) | Admitting: Family Medicine

## 2018-04-23 VITALS — BP 145/84 | HR 89 | Temp 98.2°F | Resp 16 | Ht 69.0 in | Wt 188.0 lb

## 2018-04-23 DIAGNOSIS — J0100 Acute maxillary sinusitis, unspecified: Secondary | ICD-10-CM

## 2018-04-23 MED ORDER — BENZONATATE 100 MG PO CAPS: 100.0000 mg | ORAL_CAPSULE | Freq: Three times a day (TID) | ORAL | 0 refills | Status: DC | PRN

## 2018-04-23 MED ORDER — IPRATROPIUM BROMIDE 0.06 % NA SOLN: 2.0000 | Freq: Four times a day (QID) | NASAL | 0 refills | Status: DC

## 2018-04-23 NOTE — Patient Instructions (Addendum)
Thank you for coming to the office today.  1. It sounds like you have persistent Sinus Congestion or "Rhinosinusitis" - I do not think that this is a Bacterial Sinus Infection. Usually these are caused by Viruses or Allergies, and will run it's course in about 7 to 10 days. - No antibiotics are needed at this time  Start Atrovent nasal spray decongestant 2 sprays in each nostril up to 4 times daily for 7 days  Start Tessalon Perls take 1 capsule up to 3 times a day as needed for cough  Start BEHIND the counter (ask pharmacist) for original pseudophed or can get combo with ibuprofen or claritin - decongestant medicine!  If not clearing mucus - May try OTC Mucinex (or may try Mucinex-DM for cough) up to 7-10 days then stop  - Improve hydration by drinking plenty of clear fluids (water, gatorade) to reduce secretions and thin congestion - Congestion draining down throat can cause irritation. May try warm herbal tea with honey, cough drops - Can take Tylenol or Ibuprofen as needed for fevers - May continue over the counter cold medicine as you are, I would not use any decongestant or mucinex longer than 7 days.  If you develop persistent fever >101F for at least 3 consecutive days, headaches with sinus pain or pressure or persistent earache, please schedule a follow-up evaluation within next few days to week.  If NOT IMPROVED at all or better then worse again within 5-7 days, call or come back and we can consider 2nd options such as Antibiotic course.  Please schedule a Follow-up Appointment to: Return in about 1 week (around 04/30/2018), or if symptoms worsen or fail to improve, for sinus.  If you have any other questions or concerns, please feel free to call the office or send a message through MyChart. You may also schedule an earlier appointment if necessary.  Additionally, you may be receiving a survey about your experience at our office within a few days to 1 week by e-mail or mail. We  value your feedback.  Saralyn Pilar, DO [facility], [place]

## 2018-04-23 NOTE — Progress Notes (Signed)
Subjective:    Patient ID: Randy Buchanan, male    DOB: 1980/11/10, 38 y.o.   MRN: [id number]  Randy Buchanan is a 38 y.o. male presenting on 04/23/2018 for Sinusitis (facial pressure onset yesterday getting worst nasal congestion denies fever, chills or bodyaches)  Patient presents for a same day appointment.  HPI   SINUSITIS Reports symptoms onset yesterday then worsening now today with facial pressure and nasal congestion, and some productive green mucus - Sick contact daughter 1-2 week ago URI - Admits sinus pressure - Denies fevers, chills, body aches  Health Maintenance: Does not get Flu Shot, he declines  Depression screen [facility] 2[facility] 04/23/2018 03/20/2017  Decreased Interest 0 0  Down, Depressed, Hopeless 0 0  PHQ - 2 Score 0 0    Social History   Tobacco Use  . Smoking status: Former Smoker    Packs/day: 0.50    Years: 2.00    Pack years: 1.00    Types: Cigarettes  . Smokeless tobacco: Current User    Types: Snuff  Substance Use Topics  . Alcohol use: Yes    Comment: 5-6 beers in three day period  . Drug use: No    Review of Systems Per HPI unless specifically indicated above     Objective:    BP (!) 145/84   Pulse 89   Temp 98.2 F (36.8 C) (Oral)   Resp 16   Wt 188 lb (85.3 kg)   SpO2 100%   BMI 27.76 kg/m   Wt Readings from Last 3 Encounters:  04/23/18 188 lb (85.3 kg)  03/20/17 212 lb 9.6 oz (96.4 kg)  11/23/16 195 lb (88.5 kg)    Physical Exam Vitals signs and nursing note reviewed.  Constitutional:      General: He is not in acute distress.    Appearance: He is well-developed. He is not diaphoretic.     Comments: Tired appearing, comfortable, cooperative  HENT:     Head: Normocephalic and atraumatic.     Comments: Mild maxillary sinuses tender. Nares with turbinate edema and clear congestion. Bilateral TMs clear with R>L mild clear effusion, without erythema or bulging. Oropharynx generalized erythema and some post nasal drainage without  exudates or asymmetry. Eyes:     General:        Right eye: No discharge.        Left eye: No discharge.     Conjunctiva/sclera: Conjunctivae normal.  Neck:     Musculoskeletal: Normal range of motion and neck supple.     Thyroid: No thyromegaly.  Cardiovascular:     Rate and Rhythm: Normal rate and regular rhythm.     Heart sounds: Normal heart sounds. No murmur.  Pulmonary:     Effort: Pulmonary effort is normal. No respiratory distress.     Breath sounds: No wheezing or rales.     Comments: Scattered rhonchi improve with cough Musculoskeletal: Normal range of motion.  Lymphadenopathy:     Cervical: No cervical adenopathy.  Skin:    General: Skin is warm and dry.     Findings: No erythema or rash.  Neurological:     Mental Status: He is alert and oriented to person, place, and time.  Psychiatric:        Behavior: Behavior normal.     Comments: Well groomed, good eye contact, normal speech and thoughts    Results for orders placed or performed during the Buchanan encounter of 09/03/13  Surgical pcr screen  Result Value  Ref Range   MRSA, PCR NEGATIVE NEGATIVE   Staphylococcus aureus POSITIVE (A) NEGATIVE  Basic metabolic panel  Result Value Ref Range   Sodium 138 137 - 147 mEq/L   Potassium 4.2 3.7 - 5.3 mEq/L   Chloride 101 96 - 112 mEq/L   CO2 26 19 - 32 mEq/L   Glucose, Bld 94 70 - 99 mg/dL   BUN 16 6 - 23 mg/dL   Creatinine, Ser 8.78 0.50 - 1.35 mg/dL   Calcium 67.6 8.4 - 72.0 mg/dL   GFR calc non Af Amer >90 >90 mL/min   GFR calc Af Amer >90 >90 mL/min  CBC  Result Value Ref Range   WBC 5.0 4.0 - 10.5 K/uL   RBC 4.98 4.22 - 5.81 MIL/uL   Hemoglobin 15.4 13.0 - 17.0 g/dL   HCT 94.7 09.6 - 28.3 %   MCV 85.9 78.0 - 100.0 fL   MCH 30.9 26.0 - 34.0 pg   MCHC 36.0 30.0 - 36.0 g/dL   RDW 66.2 94.7 - 65.4 %   Platelets 295 150 - 400 K/uL      Assessment & Plan:   Problem List Items Addressed This Visit    None    Visit Diagnoses    Acute non-recurrent  maxillary sinusitis    -  Primary   Relevant Medications   ipratropium (ATROVENT) 0.06 % nasal spray   benzonatate (TESSALON) 100 MG capsule      Consistent with acute rhinosinusitis, likely viral URI vs allergic rhinitis component without worsening concern for bacterial infection, since duration < 24 hours currently.  Plan: 1. Reassurance, likely self-limited - no indication for antibiotics at this time - Emphasis on OTC Pseudophedrine, can get behind counter, more effective decongestant 2. Start Atrovent nasal spray decongestant 2 sprays in each nostril up to 4 times daily for 7 days 3. Start Tessalon Perls take 1 capsule up to 3 times a day as needed for cough 4. May take anti histamine, OTC cold medicine, Tylenol/NSAID, mucinex 5. Return criteria reviewed - if worse or new concerns within 5-7 days can notify office, would consider send in antibiotic at that time.   Meds ordered this encounter  Medications  . ipratropium (ATROVENT) 0.06 % nasal spray    Sig: Place 2 sprays into both nostrils 4 (four) times daily. For up to 5-7 days then stop.    Dispense:  15 mL    Refill:  0  . benzonatate (TESSALON) 100 MG capsule    Sig: Take 1 capsule (100 mg total) by mouth 3 (three) times daily as needed for cough.    Dispense:  30 capsule    Refill:  0      Follow up plan: Return in about 1 week (around 04/30/2018), or if symptoms worsen or fail to improve, for sinus.   Randy Pilar, DO [facility] 04/23/2018, 11:49 AM

## 2018-04-24 ENCOUNTER — Encounter: Payer: Self-pay | Admitting: Family Medicine
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
## 2019-03-24 ENCOUNTER — Encounter: Payer: Self-pay | Admitting: *Deleted

## 2019-03-24 ENCOUNTER — Emergency Department: Payer: Self-pay

## 2019-03-24 ENCOUNTER — Other Ambulatory Visit: Payer: Self-pay

## 2019-03-24 ENCOUNTER — Emergency Department
Admission: EM | Admit: 2019-03-24 | Discharge: 2019-03-25 | Disposition: A | Discharge status: Home / Self Care | Payer: Self-pay | Attending: Emergency Medicine | Admitting: Emergency Medicine

## 2019-03-24 DIAGNOSIS — Y999 Unspecified external cause status: Secondary | ICD-10-CM | POA: Insufficient documentation

## 2019-03-24 DIAGNOSIS — F17220 Nicotine dependence, chewing tobacco, uncomplicated: Secondary | ICD-10-CM | POA: Insufficient documentation

## 2019-03-24 DIAGNOSIS — Y9389 Activity, other specified: Secondary | ICD-10-CM | POA: Insufficient documentation

## 2019-03-24 DIAGNOSIS — S61239A Puncture wound without foreign body of unspecified finger without damage to nail, initial encounter: Secondary | ICD-10-CM

## 2019-03-24 DIAGNOSIS — I10 Essential (primary) hypertension: Secondary | ICD-10-CM | POA: Insufficient documentation

## 2019-03-24 DIAGNOSIS — S61237A Puncture wound without foreign body of left little finger without damage to nail, initial encounter: Secondary | ICD-10-CM | POA: Insufficient documentation

## 2019-03-24 DIAGNOSIS — W3400XA Accidental discharge from unspecified firearms or gun, initial encounter: Secondary | ICD-10-CM | POA: Insufficient documentation

## 2019-03-24 DIAGNOSIS — Y92091 Bathroom in other non-institutional residence as the place of occurrence of the external cause: Secondary | ICD-10-CM | POA: Insufficient documentation

## 2019-03-24 MED ORDER — MORPHINE SULFATE (PF) 4 MG/ML IV SOLN
4.0000 mg | Freq: Once | INTRAVENOUS | Status: DC
  Filled 2019-03-24: qty 1

## 2019-03-24 MED ORDER — ONDANSETRON HCL 4 MG/2ML IJ SOLN
4.0000 mg | Freq: Once | INTRAMUSCULAR | Status: DC
  Filled 2019-03-24: qty 2

## 2019-03-24 MED ORDER — LIDOCAINE-EPINEPHRINE-TETRACAINE (LET) TOPICAL GEL
3.0000 mL | Freq: Once | TOPICAL | Status: AC
  Administered 2019-03-24: 3 mL via TOPICAL

## 2019-03-24 MED ORDER — ALPRAZOLAM 0.25 MG PO TABS
1.0000 mg | ORAL_TABLET | Freq: Once | ORAL | Status: AC
  Administered 2019-03-24: 1 mg via ORAL
  Filled 2019-03-24: qty 4

## 2019-03-24 MED ORDER — FENTANYL CITRATE (PF) 100 MCG/2ML IJ SOLN
100.0000 ug | Freq: Once | INTRAMUSCULAR | Status: AC
  Administered 2019-03-24: 100 ug via INTRAVENOUS
  Filled 2019-03-24: qty 2

## 2019-03-24 MED ORDER — CEFAZOLIN SODIUM-DEXTROSE 1-4 GM/50ML-% IV SOLN
1.0000 g | Freq: Once | INTRAVENOUS | Status: AC
  Administered 2019-03-24: 1 g via INTRAVENOUS
  Filled 2019-03-24 (×3): qty 50
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
## 2019-03-24 NOTE — ED Notes (Signed)
NP at the bedside for pt evaluation   
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
## 2019-03-24 NOTE — ED Notes (Signed)
Pharmacy notified again for medication.

## 2019-03-25 MED ORDER — SULFAMETHOXAZOLE-TRIMETHOPRIM 800-160 MG PO TABS: 1.0000 | ORAL_TABLET | Freq: Two times a day (BID) | ORAL | 0 refills | Status: DC

## 2019-03-25 MED ORDER — HYDROCODONE-ACETAMINOPHEN 5-325 MG PO TABS
1.0000 | ORAL_TABLET | Freq: Once | ORAL | Status: AC
  Administered 2019-03-25: 1 via ORAL
  Filled 2019-03-25: qty 1

## 2019-03-25 MED ORDER — CEPHALEXIN 500 MG PO CAPS: 500.0000 mg | ORAL_CAPSULE | Freq: Three times a day (TID) | ORAL | 0 refills | Status: AC

## 2019-03-25 MED ORDER — OXYCODONE-ACETAMINOPHEN 10-325 MG PO TABS: 1.0000 | ORAL_TABLET | Freq: Four times a day (QID) | ORAL | 0 refills | Status: AC | PRN

## 2019-03-25 NOTE — Discharge Instructions (Signed)
Call orthopedics first thing in the morning to request a follow-up appointment.  Do not take the pain medication if you are going to work.  You may also take ibuprofen in addition to the pain medication.  Ice and elevate your hand often throughout the next few days.  Leave the dressing in place for approximately 3 days unless it becomes wet or dirty, then replace it.  Return to the emergency department immediately for any concern of infection or if pain changes or worsens.

## 2019-03-25 NOTE — ED Notes (Signed)
Gauze and splint applied.

## 2020-05-02 ENCOUNTER — Ambulatory Visit: Payer: Self-pay | Admitting: *Deleted

## 2020-05-02 NOTE — Telephone Encounter (Signed)
Patient called to discuss issues with possible hernia. Call disconnected before speaking to patient. Attempted to call patient back. No answer. Messaged noted call can not be completed at this time, please try your call again later. Will await call back from patient if assistance needed.

## 2020-05-03 ENCOUNTER — Other Ambulatory Visit: Payer: Self-pay

## 2020-05-03 ENCOUNTER — Ambulatory Visit (INDEPENDENT_AMBULATORY_CARE_PROVIDER_SITE_OTHER): Payer: Self-pay | Admitting: Unknown Physician Specialty

## 2020-05-03 VITALS — BP 150/83 | HR 79 | Ht 68.0 in | Wt 213.6 lb

## 2020-05-03 DIAGNOSIS — I10 Essential (primary) hypertension: Secondary | ICD-10-CM

## 2020-05-03 DIAGNOSIS — K429 Umbilical hernia without obstruction or gangrene: Secondary | ICD-10-CM | POA: Insufficient documentation

## 2020-05-03 MED ORDER — LISINOPRIL 5 MG PO TABS: 5.0000 mg | ORAL_TABLET | Freq: Every day | ORAL | 3 refills | Status: DC

## 2020-05-03 NOTE — Progress Notes (Signed)
BP (!) 150/83   Pulse 79   Ht 5\' 8"  (1.727 m)   Wt 213 lb 9.6 oz (96.9 kg)   SpO2 99%   BMI 32.48 kg/m    Subjective:    Patient ID: Randy Buchanan, male    DOB: 03/10/1981, 40 y.o.   MRN: [id number]  HPI: Randy Buchanan is a 40 y.o. male  Chief Complaint  Patient presents with  . Umbilical Hernia    Tenderness inside belly button   Umbilical hernia Pt recently noticing a bulging in umbilicus.  No constipation or pain.  Just wonders what he should do.     Hypertension Pt states he has been on BP meds before, lost weight, and BP has come back.  Taking BP at home and finds 135-145/90-100.  States he can "feel it" when his BP goes up.  No recent labs.  States he has a positive family history.  No SOB or chest pain    Relevant past medical, surgical, family and social history reviewed and updated as indicated. Interim medical history since our last visit reviewed. Allergies and medications reviewed and updated.  Review of Systems  Constitutional: Negative.   HENT: Negative.   Respiratory: Negative.   Cardiovascular: Negative.   Gastrointestinal: Negative.   Psychiatric/Behavioral: Negative.     Per HPI unless specifically indicated above     Objective:    BP (!) 150/83   Pulse 79   Ht 5\' 8"  (1.727 m)   Wt 213 lb 9.6 oz (96.9 kg)   SpO2 99%   BMI 32.48 kg/m   Wt Readings from Last 3 Encounters:  05/03/20 213 lb 9.6 oz (96.9 kg)  03/24/19 185 lb (83.9 kg)  04/23/18 188 lb (85.3 kg)    Physical Exam Constitutional:      General: He is not in acute distress.    Appearance: Normal appearance. He is well-developed and well-nourished.  HENT:     Head: Normocephalic and atraumatic.  Eyes:     General: Lids are normal. No scleral icterus.       Right eye: No discharge.        Left eye: No discharge.     Conjunctiva/sclera: Conjunctivae normal.  Neck:     Vascular: No carotid bruit or JVD.  Cardiovascular:     Rate and Rhythm: Normal rate and regular rhythm.      Heart sounds: Normal heart sounds.  Pulmonary:     Effort: Pulmonary effort is normal. No respiratory distress.     Breath sounds: Normal breath sounds.  Abdominal:     General: Abdomen is flat.     Palpations: Abdomen is soft. There is no hepatomegaly or splenomegaly.     Comments: Umbilical hernia  Musculoskeletal:        General: Normal range of motion.     Cervical back: Normal range of motion and neck supple.  Skin:    General: Skin is warm, dry and intact.     Coloration: Skin is not pale.     Findings: No rash.  Neurological:     Mental Status: He is alert and oriented to person, place, and time.  Psychiatric:        Mood and Affect: Mood and affect normal.        Behavior: Behavior normal.        Thought Content: Thought content normal.        Judgment: Judgment normal.      Assessment & Plan:  Problem List Items Addressed This Visit      Unprioritized   Essential hypertension - Primary    New diagnosis here.  Start Lisinopril 5 mg.  Labs today and BMP in 1 week.  Pt ed on dry cough.  Recheck here in 1 month      Relevant Medications   lisinopril (ZESTRIL) 5 MG tablet   Other Relevant Orders   Lipid Profile   TSH   Comprehensive metabolic panel   CBC   Basic Metabolic Panel (BMET)   Umbilical hernia without obstruction and without gangrene    Reassured no interventionis necessary.  Will self monitor           Follow up plan: Return in about 4 weeks (around 05/31/2020).

## 2020-05-03 NOTE — Assessment & Plan Note (Signed)
Reassured no interventionis necessary.  Will self monitor

## 2020-05-03 NOTE — Assessment & Plan Note (Signed)
New diagnosis here.  Start Lisinopril 5 mg.  Labs today and BMP in 1 week.  Pt ed on dry cough.  Recheck here in 1 month

## 2020-05-05 ENCOUNTER — Other Ambulatory Visit: Payer: Self-pay | Admitting: *Deleted

## 2020-05-05 DIAGNOSIS — I10 Essential (primary) hypertension: Secondary | ICD-10-CM

## 2020-05-06 ENCOUNTER — Other Ambulatory Visit: Payer: Self-pay
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
## 2021-08-30 DIAGNOSIS — B348 Other viral infections of unspecified site: Secondary | ICD-10-CM | POA: Diagnosis not present

## 2022-03-01 DIAGNOSIS — S0502XA Injury of conjunctiva and corneal abrasion without foreign body, left eye, initial encounter: Secondary | ICD-10-CM | POA: Diagnosis not present

## 2023-03-22 DIAGNOSIS — K37 Unspecified appendicitis: Secondary | ICD-10-CM | POA: Diagnosis not present

## 2023-03-22 DIAGNOSIS — R1031 Right lower quadrant pain: Secondary | ICD-10-CM | POA: Diagnosis not present

## 2023-03-22 DIAGNOSIS — R10813 Right lower quadrant abdominal tenderness: Secondary | ICD-10-CM | POA: Diagnosis not present

## 2023-04-19 ENCOUNTER — Ambulatory Visit: Payer: BC Managed Care – PPO | Admitting: Family Medicine

## 2023-04-19 ENCOUNTER — Encounter: Payer: Self-pay | Admitting: Family Medicine

## 2023-04-19 ENCOUNTER — Telehealth: Payer: Self-pay | Admitting: Family Medicine

## 2023-04-19 VITALS — BP 140/94 | HR 83 | Ht 68.0 in | Wt 218.0 lb

## 2023-04-19 DIAGNOSIS — I10 Essential (primary) hypertension: Secondary | ICD-10-CM | POA: Diagnosis not present

## 2023-04-19 DIAGNOSIS — R7309 Other abnormal glucose: Secondary | ICD-10-CM

## 2023-04-19 DIAGNOSIS — E66811 Obesity, class 1: Secondary | ICD-10-CM | POA: Diagnosis not present

## 2023-04-19 MED ORDER — AMLODIPINE BESYLATE 5 MG PO TABS: 5.0000 mg | ORAL_TABLET | Freq: Every day | ORAL | 2 refills | Status: AC

## 2023-04-19 MED ORDER — WEGOVY 0.5 MG/0.5ML ~~LOC~~ SOAJ: 0.5000 mg | SUBCUTANEOUS | 0 refills | Status: DC

## 2023-04-19 NOTE — Telephone Encounter (Signed)
I called him back to clarify a few things. I advised him that "medical coverage" usually means cardiovascular disease heart disease. He does not have this. I do not think it will be covered. I would suggest we can try for Hypertension in addition to the weight and see if this is possible.  I will order it later today on the actual encounter for office visit and put info on the PA  Saralyn Pilar, DO [facility] 04/19/2023, 2:01 PM

## 2023-04-19 NOTE — Progress Notes (Signed)
Subjective:    Patient ID: Randy Buchanan, male    DOB: 19-Oct-1980, 43 y.o.   MRN: [id number]  Randy Buchanan is a 43 y.o. male presenting on 04/19/2023 for Hypertension and Obesity   HPI  Discussed the use of AI scribe software for clinical note transcription with the patient, who gave verbal consent to proceed.  History of Present Illness    Hypertension Obesity BMI >33  The patient, with a significant weight gain of approximately 40 pounds over the past five years, presents for a consultation on weight management. 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 Despite attempts at healthier eating and incorporating lean meats into his diet, he has not observed significant weight loss with >6+ month of interventions.  Previously, the patient had attempted a structured exercise regimen, which included walking and running. This regimen had initially resulted in some weight loss. However, due to pre-existing back issues and the onset of sciatic pain, he had to discontinue this exercise regimen.  The patient has expressed interest in weight loss medications, specifically Wegovy, as he has observed positive results in his spouse who is currently on this medication. He has not previously tried any weight loss medications.  In addition to his weight concerns, the patient also reports a history of high blood pressure. He had previously been on a low dose of Lisinopril 5mg , which he felt had been beneficial. However, he has been off this medication for some time and his blood pressure has remained elevated.        04/19/2023    9:43 AM 04/23/2018   11:35 AM 03/20/2017   10:47 AM  Depression screen PHQ 2/9  Decreased Interest 0 0 0  Down, Depressed, Hopeless 0 0 0  PHQ - 2 Score 0 0 0  Altered sleeping 2    Tired, decreased energy 0    Change in appetite 0    Feeling bad or failure about yourself  0    Trouble  concentrating 2    Moving slowly or fidgety/restless 0    Suicidal thoughts 0    PHQ-9 Score 4    Difficult doing work/chores Not difficult at all         04/19/2023    9:44 AM  GAD 7 : Generalized Anxiety Score  Nervous, Anxious, on Edge 0  Control/stop worrying 2  Worry too much - different things 2  Trouble relaxing 2  Restless 0  Easily annoyed or irritable 0  Afraid - awful might happen 0  Total GAD 7 Score 6  Anxiety Difficulty Not difficult at all    Social History   Tobacco Use   Smoking status: Former    Current packs/day: 0.50    Average packs/day: 0.5 packs/day for 2.0 years (1.0 ttl pk-yrs)    Types: Cigarettes   Smokeless tobacco: Current    Types: Snuff  Substance Use Topics   Alcohol use: Yes    Comment: 5-6 beers in three day period   Drug use: No    Review of Systems Per HPI unless specifically indicated above     Objective:    BP (!) 142/90   Pulse 83   Ht 5\' 8"  (1.727 m)   Wt 218 lb (98.9 kg)   SpO2 100%   BMI 33.15 kg/m   Wt Readings from Last 3 Encounters:  04/19/23 218 lb (98.9 kg)  05/03/20 213  lb 9.6 oz (96.9 kg)  03/24/19 185 lb (83.9 kg)    Physical Exam Vitals and nursing note reviewed.  Constitutional:      General: He is not in acute distress.    Appearance: Normal appearance. He is well-developed. He is not diaphoretic.     Comments: Well-appearing, comfortable, cooperative  HENT:     Head: Normocephalic and atraumatic.  Eyes:     General:        Right eye: No discharge.        Left eye: No discharge.     Conjunctiva/sclera: Conjunctivae normal.  Cardiovascular:     Rate and Rhythm: Normal rate.  Pulmonary:     Effort: Pulmonary effort is normal.  Skin:    General: Skin is warm and dry.     Findings: No erythema or rash.  Neurological:     Mental Status: He is alert and oriented to person, place, and time.  Psychiatric:        Mood and Affect: Mood normal.        Behavior: Behavior normal.        Thought  Content: Thought content normal.     Comments: Well groomed, good eye contact, normal speech and thoughts        Results for orders placed or performed during the hospital encounter of 09/03/13  Surgical pcr screen   Collection Time: 09/03/13  8:03 AM   Specimen: Nasal Mucosa; Nasal Swab  Result Value Ref Range   MRSA, PCR NEGATIVE NEGATIVE   Staphylococcus aureus POSITIVE (A) NEGATIVE  Basic metabolic panel   Collection Time: 09/03/13  8:50 AM  Result Value Ref Range   Sodium 138 137 - 147 mEq/L   Potassium 4.2 3.7 - 5.3 mEq/L   Chloride 101 96 - 112 mEq/L   CO2 26 19 - 32 mEq/L   Glucose, Bld 94 70 - 99 mg/dL   BUN 16 6 - 23 mg/dL   Creatinine, Ser 8.65 0.50 - 1.35 mg/dL   Calcium 78.4 8.4 - 69.6 mg/dL   GFR calc non Af Amer >90 >90 mL/min   GFR calc Af Amer >90 >90 mL/min  CBC   Collection Time: 09/03/13  8:50 AM  Result Value Ref Range   WBC 5.0 4.0 - 10.5 K/uL   RBC 4.98 4.22 - 5.81 MIL/uL   Hemoglobin 15.4 13.0 - 17.0 g/dL   HCT 29.5 28.4 - 13.2 %   MCV 85.9 78.0 - 100.0 fL   MCH 30.9 26.0 - 34.0 pg   MCHC 36.0 30.0 - 36.0 g/dL   RDW 44.0 10.2 - 72.5 %   Platelets 295 150 - 400 K/uL      Assessment & Plan:   Problem List Items Addressed This Visit     Essential hypertension   Relevant Medications   amLODipine (NORVASC) 5 MG tablet   WEGOVY 0.5 MG/0.5ML SOAJ   Obesity (BMI 30.0-34.9) - Primary   Relevant Medications   WEGOVY 0.5 MG/0.5ML SOAJ   Other Visit Diagnoses       Abnormal glucose       Relevant Orders   Hemoglobin A1c        Obesity BMI >33 Weight gain of approximately 40 pounds over the past 5 years. Sedentary lifestyle >6+ months previous attempts at weight loss through diet modification and exercise were hindered by back issues. Patient expressed interest in weight loss medications, specifically Wegovy or Zepbound.  -Check A1C today to determine eligibility for weight loss  medications. -Patient to verify insurance coverage for  Wegovy and Zepbound first -Plan to initiate weight loss medication based on A1C results and insurance coverage.  *Update - patient called back and reports that [facility] is covered if medically necessary other than weight loss.  Hypertension Elevated blood pressure readings (142/94). Patient has been off antihypertensive medication for a while. -Start Amlodipine, a calcium channel blocker, with a 30-day supply. -Check blood pressure at home a couple of times a week, especially since starting new medication. -Follow-up appointment in 3 months to assess progress.      ____________________________________________________ Additional Rx Information (May be used for Prior Authorization if required)  Medication name and Strength: Wegovy 0.5 mg  Primary Diagnosis and ICD10 Code: Obesity BMI >33  (Y78.295) Secondary Diagnosis and ICD10 Code: Hypertension (I10) Previous Failed Medications None Quantity and Duration of New Medication: 2 mL for 28 days Additional Supporting Information: Medically indicated given comorbid condition with hypertension ____________________________________________________    Orders Placed This Encounter  Procedures   Hemoglobin A1c    Meds ordered this encounter  Medications   amLODipine (NORVASC) 5 MG tablet    Sig: Take 1 tablet (5 mg total) by mouth daily.    Dispense:  30 tablet    Refill:  2   WEGOVY 0.5 MG/0.5ML SOAJ    Sig: Inject 0.5 mg into the skin once a week.    Dispense:  2 mL    Refill:  0    Follow up plan: Return in about 3 months (around 07/18/2023), or 3 month follow-up Weight, BP, for 3 month follow-up Weight, BP.   Saralyn Pilar, DO [facility] 04/19/2023, 10:07 AM

## 2023-04-19 NOTE — Telephone Encounter (Signed)
Pt is calling in requesting to speak with Dr. Kirtland Bouchard. Pt says he is trying to get a prescription and Dr. Kirtland Bouchard needed some info from his insurance company and he is calling back to give Dr. Kirtland Bouchard the information.

## 2023-04-19 NOTE — Patient Instructions (Addendum)
Thank you for coming to the office today.  Zepbound TO Check Cost & Coverage of Zepbound Please contact Research officer, trade union (manufacturer for [employer]) 1-800-LillyRx [phone]) - Live agent to discuss cost and coverage.   2.5 > 5 > 7.5 > 10 > 12.5 > 15  Monthly dose increase ---------------------------   [employer] Verified Coverage   [email or web address]     Let me know once you find out coverage and we can order   First order is Zepbound 2.5 mg weekly, 4 pens per month, if doing well contact me and we can increase the order to higher dose next month.    ------------------------------------------------------------  For Weight Loss / Obesity only   Contrave - oral medication, appetite suppression has wellbutrin/bupropion and naltrexone in it and it can also help with appetite, it is ordered through a speciality pharmacy. - $99 per month   Free sample 7 day, 1 pill per day for 1 week   Alternative options   Semaglutide injection (mixed Ozempic) from [facility] 0.25mg  weekly for 4 weeks then increase to 0.5mg  weekly It comes in a vial and a needle syringe, you need to draw up the shot and self admin it weekly Cost is about $200 per month Call them to check pricing and availability   [facility] Address: [address] [place], [place], [place] [address] Phone: [phone]   --------------------------------     [facility] Online Virtual Doctor Team With insurance $79/mo + copay for medications Without insurance $79/mo + $99/mo to include medication (Semaglutide) Without insurance $79/mo + $199/mo to include medication (Tirzepatide) [email or web address]    Please schedule a Follow-up Appointment to: Return in about 3 months (around 07/18/2023), or 3 month follow-up Weight, BP, for 3 month follow-up Weight, BP.  If you have any other questions or concerns, please feel free to  call the office or send a message through MyChart. You may also schedule an earlier appointment if necessary.  Additionally, you may be receiving a survey about your experience at our office within a few days to 1 week by e-mail or mail. We value your feedback.  Saralyn Pilar, DO [facility], [place]

## 2023-04-20 LAB — HEMOGLOBIN A1C
Hgb A1c MFr Bld: 5.7 %{Hb} — ABNORMAL HIGH (ref <5.7)
Mean Plasma Glucose: 117 mg/dL
eAG (mmol/L): 6.5 mmol/L

## 2023-04-22 NOTE — Progress Notes (Signed)
PA started   Randy Buchanan (Key: [id number]) Rx #: [id number] MVHQIO 0.5MG /0.5ML auto-injectors Form Valinda Hoar [place] [employer] Electronic Request Form     Approved 04/22/23-08/26/23

## 2023-04-23 ENCOUNTER — Encounter: Payer: Self-pay | Admitting: Family Medicine

## 2023-07-17 ENCOUNTER — Ambulatory Visit: Payer: Self-pay | Admitting: Family Medicine

## 2023-09-19 ENCOUNTER — Other Ambulatory Visit: Payer: Self-pay | Admitting: Family Medicine

## 2023-09-19 DIAGNOSIS — I10 Essential (primary) hypertension: Secondary | ICD-10-CM

## 2023-09-19 DIAGNOSIS — E66811 Obesity, class 1: Secondary | ICD-10-CM

## 2023-09-19 NOTE — Telephone Encounter (Unsigned)
 Copied from CRM [id number]. Topic: Clinical - Medication Refill >> Sep 19, 2023 10:43 AM Oddis Bench wrote: Medication: WEGOVY  0.5 MG/0.5ML SOAJ  Has the patient contacted their pharmacy? Yes Needs to have provider call in   This is the patient's preferred pharmacy:  [id number] - [place], [place] - [address] [place] [address] Phone: [phone] Fax: [phone]  Is this the correct pharmacy for this prescription? Yes If no, delete pharmacy and type the correct one.   Has the prescription been filled recently? No  Is the patient out of the medication? Yes  Has the patient been seen for an appointment in the last year OR does the patient have an upcoming appointment? Yes  Can we respond through MyChart? No  Agent: Please be advised that Rx refills may take up to 3 business days. We ask that you follow-up with your pharmacy.

## 2023-09-23 NOTE — Telephone Encounter (Signed)
 Patient needs appt

## 2023-09-23 NOTE — Telephone Encounter (Signed)
 Requested medication (s) are due for refill today: yes  Requested medication (s) are on the active medication list: yes  Last refill:  04/19/23  Future visit scheduled: no  Notes to clinic:  Unable to refill per protocol, appointment needed. Called patient, no answer. Routing for review for courtesy refill     Requested Prescriptions  Pending Prescriptions Disp Refills   WEGOVY  0.5 MG/0.5ML SOAJ 2 mL 0    Sig: Inject 0.5 mg into the skin once a week.     Endocrinology:  Diabetes - GLP-1 Receptor Agonists - semaglutide  Failed - 09/23/2023  9:19 AM      Failed - HBA1C in normal range and within 180 days    Hgb A1c MFr Bld  Date Value Ref Range Status  04/19/2023 5.7 (H) <5.7 % of total Hgb Final    Comment:    For someone without known diabetes, a hemoglobin  A1c value between 5.7% and 6.4% is consistent with prediabetes and should be confirmed with a  follow-up test. . For someone with known diabetes, a value <7% indicates that their diabetes is well controlled. A1c targets should be individualized based on duration of diabetes, age, comorbid conditions, and other considerations. . This assay result is consistent with an increased risk of diabetes. . Currently, no consensus exists regarding use of hemoglobin A1c for diagnosis of diabetes for children. .          Failed - Cr in normal range and within 360 days    Creatinine, Ser  Date Value Ref Range Status  09/03/2013 0.75 0.50 - 1.35 mg/dL Final         Failed - Valid encounter within last 6 months    Recent Outpatient Visits   None

## 2023-09-23 NOTE — Telephone Encounter (Signed)
 Called patient to schedule F/U OV for additional medication refills. No answer left VM to call back to schedule.

## 2023-09-27 ENCOUNTER — Other Ambulatory Visit (HOSPITAL_COMMUNITY): Payer: Self-pay

## 2023-09-27 ENCOUNTER — Telehealth: Payer: Self-pay

## 2023-09-27 NOTE — Telephone Encounter (Signed)
 Pharmacy Patient Advocate Encounter   Received notification from CoverMyMeds that prior authorization for Wegovy  0.5MG /0.5ML auto-injectors is required/requested.   Insurance verification completed.   The patient is insured through [facility] .   Per test claim: PA required; PA submitted to above mentioned insurance via CoverMyMeds Key/confirmation #/EOC [employer] Status is pending

## 2023-10-02 NOTE — Telephone Encounter (Signed)
 Pharmacy Patient Advocate Encounter  Received notification from [facility] that Prior Authorization for Wegovy  0.5MG /0.5ML auto-injectors has been DENIED.  Full denial letter will be uploaded to the media tab. See denial reason below.

## 2023-10-02 NOTE — Telephone Encounter (Signed)
 FYI patient's Wegovy  was denied due to insurance. He will need to follow-up in future to discuss this or other routine concerns.  Marsa Officer, DO [facility] 10/02/2023, 12:14 PM

## 2023-10-02 NOTE — Addendum Note (Signed)
 Addended by: EDMAN MARSA PARAS on: 10/02/2023 12:14 PM   Modules accepted: Orders

## 2023-10-03 ENCOUNTER — Other Ambulatory Visit (HOSPITAL_COMMUNITY): Payer: Self-pay
